# Patient Record
Sex: Female | Born: 1945 | Race: White | Hispanic: No | Marital: Married | Smoking: Never smoker
Health system: Southern US, Community
[De-identification: ages and names within clinical notes are randomized; demographics above are authoritative.]

## PROBLEM LIST (undated history)

## (undated) DIAGNOSIS — R430 Anosmia: Secondary | ICD-10-CM

## (undated) DIAGNOSIS — F32A Depression, unspecified: Secondary | ICD-10-CM

## (undated) DIAGNOSIS — F329 Major depressive disorder, single episode, unspecified: Secondary | ICD-10-CM

## (undated) DIAGNOSIS — G8929 Other chronic pain: Secondary | ICD-10-CM

## (undated) DIAGNOSIS — Z8679 Personal history of other diseases of the circulatory system: Secondary | ICD-10-CM

## (undated) DIAGNOSIS — M79604 Pain in right leg: Secondary | ICD-10-CM

## (undated) DIAGNOSIS — I2692 Saddle embolus of pulmonary artery without acute cor pulmonale: Secondary | ICD-10-CM

## (undated) DIAGNOSIS — M549 Dorsalgia, unspecified: Secondary | ICD-10-CM

## (undated) DIAGNOSIS — E785 Hyperlipidemia, unspecified: Secondary | ICD-10-CM

## (undated) HISTORY — DX: Personal history of other diseases of the circulatory system: Z86.79

## (undated) HISTORY — DX: Hyperlipidemia, unspecified: E78.5

## (undated) HISTORY — DX: Anosmia: R43.0

## (undated) HISTORY — DX: Depression, unspecified: F32.A

## (undated) HISTORY — DX: Major depressive disorder, single episode, unspecified: F32.9

## (undated) HISTORY — DX: Saddle embolus of pulmonary artery without acute cor pulmonale: I26.92

## (undated) HISTORY — PX: OTHER SURGICAL HISTORY: SHX169

## (undated) HISTORY — PX: TUBAL LIGATION: SHX77

---

## 1999-12-12 DIAGNOSIS — R430 Anosmia: Secondary | ICD-10-CM

## 1999-12-12 HISTORY — DX: Anosmia: R43.0

## 2004-11-09 ENCOUNTER — Ambulatory Visit: Payer: Self-pay | Admitting: Internal Medicine

## 2004-12-06 ENCOUNTER — Ambulatory Visit: Payer: Self-pay | Admitting: Family Medicine

## 2005-01-10 ENCOUNTER — Ambulatory Visit: Payer: Self-pay | Admitting: Internal Medicine

## 2005-01-17 ENCOUNTER — Other Ambulatory Visit: Admission: RE | Admit: 2005-01-17 | Discharge: 2005-01-17 | Payer: Self-pay | Admitting: Internal Medicine

## 2005-01-17 ENCOUNTER — Ambulatory Visit: Payer: Self-pay | Admitting: Internal Medicine

## 2005-01-17 LAB — CONVERTED CEMR LAB: Pap Smear: NORMAL

## 2005-12-11 HISTORY — PX: COLONOSCOPY: SHX174

## 2006-05-28 ENCOUNTER — Ambulatory Visit: Payer: Self-pay | Admitting: Family Medicine

## 2006-06-04 ENCOUNTER — Ambulatory Visit: Payer: Self-pay | Admitting: Internal Medicine

## 2006-06-29 ENCOUNTER — Ambulatory Visit: Payer: Self-pay | Admitting: Gastroenterology

## 2006-07-13 ENCOUNTER — Ambulatory Visit: Payer: Self-pay | Admitting: Gastroenterology

## 2006-08-08 ENCOUNTER — Ambulatory Visit: Payer: Self-pay | Admitting: Internal Medicine

## 2006-08-15 ENCOUNTER — Ambulatory Visit: Payer: Self-pay | Admitting: Internal Medicine

## 2007-03-08 ENCOUNTER — Ambulatory Visit: Payer: Self-pay | Admitting: Internal Medicine

## 2007-03-08 LAB — CONVERTED CEMR LAB
Cholesterol: 172 mg/dL (ref 0–200)
HDL: 59 mg/dL (ref >39.0)
Triglycerides: 84 mg/dL (ref 0–149)

## 2007-03-18 ENCOUNTER — Ambulatory Visit: Payer: Self-pay | Admitting: Internal Medicine

## 2007-09-06 DIAGNOSIS — E785 Hyperlipidemia, unspecified: Secondary | ICD-10-CM

## 2007-12-12 LAB — HM MAMMOGRAPHY

## 2007-12-16 ENCOUNTER — Telehealth: Payer: Self-pay | Admitting: Internal Medicine

## 2008-03-30 ENCOUNTER — Other Ambulatory Visit: Admission: RE | Admit: 2008-03-30 | Discharge: 2008-03-30 | Payer: Self-pay | Admitting: Internal Medicine

## 2008-03-30 ENCOUNTER — Ambulatory Visit: Payer: Self-pay | Admitting: Internal Medicine

## 2008-03-30 ENCOUNTER — Encounter: Payer: Self-pay | Admitting: Internal Medicine

## 2008-03-30 DIAGNOSIS — R439 Unspecified disturbances of smell and taste: Secondary | ICD-10-CM

## 2008-03-30 DIAGNOSIS — F4321 Adjustment disorder with depressed mood: Secondary | ICD-10-CM

## 2008-03-30 LAB — CONVERTED CEMR LAB
Bilirubin Urine: NEGATIVE
Cholesterol, target level: 200 mg/dL
Glucose, Urine, Semiquant: NEGATIVE
HDL goal, serum: 40 mg/dL
Ketones, urine, test strip: NEGATIVE
LDL Goal: 160 mg/dL
Specific Gravity, Urine: 1.02

## 2008-04-02 LAB — CONVERTED CEMR LAB
ALT: 25 units/L (ref 0–35)
AST: 18 units/L (ref 0–37)
Albumin: 4.1 g/dL (ref 3.5–5.2)
BUN: 14 mg/dL (ref 6–23)
Basophils Absolute: 0 10*3/uL (ref 0.0–0.1)
Basophils Relative: 0.1 % (ref 0.0–1.0)
CO2: 31 meq/L (ref 19–32)
Calcium: 9.5 mg/dL (ref 8.4–10.5)
Chloride: 106 meq/L (ref 96–112)
Cholesterol: 184 mg/dL (ref 0–200)
Creatinine, Ser: 0.6 mg/dL (ref 0.4–1.2)
Eosinophils Absolute: 0.1 10*3/uL (ref 0.0–0.7)
Eosinophils Relative: 2 % (ref 0.0–5.0)
Hemoglobin: 14 g/dL (ref 12.0–15.0)
Lymphocytes Relative: 43.9 % (ref 12.0–46.0)
MCHC: 33.2 g/dL (ref 30.0–36.0)
MCV: 91 fL (ref 78.0–100.0)
Neutro Abs: 1.6 10*3/uL (ref 1.4–7.7)
Neutrophils Relative %: 43 % (ref 43.0–77.0)
Phosphorus: 3.6 mg/dL (ref 2.3–4.6)
RBC: 4.62 M/uL (ref 3.87–5.11)
TSH: 0.57 microintl units/mL (ref 0.35–5.50)
Total Bilirubin: 1 mg/dL (ref 0.3–1.2)
WBC: 3.7 10*3/uL — ABNORMAL LOW (ref 4.5–10.5)

## 2008-04-06 ENCOUNTER — Ambulatory Visit: Payer: Self-pay | Admitting: Internal Medicine

## 2008-04-06 ENCOUNTER — Encounter: Payer: Self-pay | Admitting: Internal Medicine

## 2009-04-26 ENCOUNTER — Telehealth: Payer: Self-pay | Admitting: *Deleted

## 2009-05-07 ENCOUNTER — Telehealth: Payer: Self-pay | Admitting: Internal Medicine

## 2009-05-11 ENCOUNTER — Ambulatory Visit: Payer: Self-pay | Admitting: Internal Medicine

## 2009-05-14 ENCOUNTER — Encounter: Payer: Self-pay | Admitting: Internal Medicine

## 2009-12-01 ENCOUNTER — Ambulatory Visit: Payer: Self-pay | Admitting: Internal Medicine

## 2009-12-01 LAB — CONVERTED CEMR LAB
Nitrite: NEGATIVE
Protein, U semiquant: 100
Specific Gravity, Urine: 1.02
Urobilinogen, UA: 0.2
pH: 7

## 2009-12-02 ENCOUNTER — Encounter: Payer: Self-pay | Admitting: Internal Medicine

## 2010-09-30 ENCOUNTER — Encounter: Payer: Self-pay | Admitting: Internal Medicine

## 2010-09-30 ENCOUNTER — Ambulatory Visit: Payer: Self-pay | Admitting: Internal Medicine

## 2010-10-03 LAB — CONVERTED CEMR LAB
ALT: 21 units/L (ref 0–35)
AST: 19 units/L (ref 0–37)
Albumin: 4.2 g/dL (ref 3.5–5.2)
Alkaline Phosphatase: 81 units/L (ref 39–117)
Basophils Absolute: 0 10*3/uL (ref 0.0–0.1)
CO2: 31 meq/L (ref 19–32)
Calcium: 9.7 mg/dL (ref 8.4–10.5)
Direct LDL: 190 mg/dL
GFR calc non Af Amer: 92.58 mL/min (ref >60)
HCT: 40.9 % (ref 36.0–46.0)
HDL: 68.2 mg/dL (ref >39.00)
Lymphs Abs: 1.7 10*3/uL (ref 0.7–4.0)
Monocytes Relative: 11.3 % (ref 3.0–12.0)
Neutrophils Relative %: 42.6 % — ABNORMAL LOW (ref 43.0–77.0)
Platelets: 264 10*3/uL (ref 150.0–400.0)
Potassium: 5.4 meq/L — ABNORMAL HIGH (ref 3.5–5.1)
RDW: 12.9 % (ref 11.5–14.6)
Sodium: 144 meq/L (ref 135–145)
TSH: 0.55 microintl units/mL (ref 0.35–5.50)
Total Protein: 6.9 g/dL (ref 6.0–8.3)
Triglycerides: 92 mg/dL (ref 0.0–149.0)
VLDL: 18.4 mg/dL (ref 0.0–40.0)
WBC: 4.2 10*3/uL — ABNORMAL LOW (ref 4.5–10.5)

## 2010-10-27 ENCOUNTER — Telehealth: Payer: Self-pay | Admitting: Internal Medicine

## 2011-01-10 NOTE — Assessment & Plan Note (Signed)
Summary: cpx/pt req to come in fasting/cjr   Vital Signs:  Patient profile:   65 year old female Menstrual status:  postmenopausal Height:      63.75 inches Weight:      152 pounds BMI:     26.39 Pulse rate:   78 / minute BP sitting:   112 / 62  (left arm) Cuff size:   regular  Vitals Entered By: Romualdo Bolk, CMA (AAMA) (September 30, 2010 10:23 AM)  Nutrition Counseling: Patient's BMI is greater than 25 and therefore counseled on weight management options. CC: CPX- No pap- Pt is fasting for labs- Pt has been off meds for 6mos to 1 year. Pt wants to discuss increasing celexa.   History of Present Illness: Tiffany Lowe comes in today  for preventive visit .  Since last visit  here  there have been no major changes in health status   but has  a very stressful situation with caretaker stress    as mom living with them now .Seh continues to work ling hours. Wants to go back on celexa  because of this. Has been off lipid med for 6 months    as rand out and just didnt take. NO se of meds per pat.    Need refill ibu for aches and pain she has ocassionally.    Preventive Care Screening  Last Tetanus Booster:    Date:  09/30/2010    Results:  Tdap  Pap Smear:    Date:  04/13/2008    Results:  normal   Mammogram:    Date:  12/12/2007    Results:  normal   Prior Values:    Pap Smear:  Normal (01/17/2005)    Colonoscopy:  Normal (07/13/2006)    Last Tetanus Booster:  Td (12/11/1998)   Preventive Screening-Counseling & Management  Alcohol-Tobacco     Alcohol drinks/day: 0     Smoking Status: never  Caffeine-Diet-Exercise     Caffeine use/day: <1     Does Patient Exercise: no     MSH Depression Score: stree  Hep-HIV-STD-Contraception     Dental Visit-last 6 months no     Dental Care Counseling: to seek dental care; no dental care within six months     Sun Exposure-Excessive: no  Safety-Violence-Falls     Seat Belt Use: yes     Firearms in the Home:  firearms in the home     Firearm Counseling: not indicated; uses recommended firearm safety measures     Smoke Detectors: yes     Fall Risk: none       Blood Transfusions:  no.    Current Medications (verified): 1)  Celexa 10 Mg Tabs (Citalopram Hydrobromide) .... Take 1 Tablet By Mouth Once A Day 2)  Zocor 40 Mg Tabs (Simvastatin) .... Take 1 Tablet By Mouth At Bedtime 3)  Ibuprofen 800 Mg Tabs (Ibuprofen) .Marland Kitchen.. 1 By Mouth Every 8 Hours As Needed  Allergies (verified): No Known Drug Allergies  Past History:  Past Medical History: Hyperlipidemia mood reactive depresion anosmia 2000-05-12 s/p viral  ? rheumatic fever   Past Surgical History: Tubal ligation colonoscopy  May 12, 2006 childbirth x 2  cataract surgery  stoneburner  Past History:  Care Management: None Current  Family History: father alzheimers   died in 05/12/2005 MOthers side CAD Family History High cholesterol OA CVA diverticultitis  mom with colostomy.   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  Married Never Smoked Alcohol use-no Risk analyst Use:  yes Dental Care w/in 6 mos.:  no Sun Exposure-Excessive:  no Fall Risk:  none  Blood Transfusions:  no  Review of Systems  The patient denies anorexia, fever, weight loss, weight gain, vision loss, hoarseness, chest pain, syncope, dyspnea on exertion, peripheral edema, prolonged cough, hemoptysis, abdominal pain, melena, hematochezia, severe indigestion/heartburn, hematuria, genital sores, muscle weakness, transient blindness, difficulty walking, unusual weight change, abnormal bleeding, enlarged lymph nodes, angioedema, and breast masses.    Physical Exam  General:  alert, well-developed, and well-nourished.   Head:  normocephalic, atraumatic, and no abnormalities observed.   Eyes:  vision grossly intact, pupils equal, and pupils round.   Ears:  R ear normal, L ear normal, and no  external deformities.   Nose:  no external deformity, no external erythema, and no nasal discharge.   Mouth:  pharynx pink and moist.   Neck:  No deformities, masses, or tenderness noted. Chest Wall:  No deformities, masses, or tenderness noted. Breasts:  No mass, nodules, thickening, tenderness, bulging, retraction, inflamation, nipple discharge or skin changes noted.   Lungs:  Normal respiratory effort, chest expands symmetrically. Lungs are clear to auscultation, no crackles or wheezes.no dullness.   Heart:  Normal rate and regular rhythm. S1 and S2 normal without gallop, murmur, click, rub or other extra sounds.no lifts.   Abdomen:  Bowel sounds positive,abdomen soft and non-tender without masses, organomegaly or hernias noted. Genitalia:  declined today   Msk:  no joint warmth, no redness over joints, and no joint deformities.   Pulses:  pulses intact without delay   Extremities:  no clubbing cyanosis or edema  Neurologic:  alert & oriented X3, strength normal in all extremities, gait normal, and DTRs symmetrical and normal.   Skin:  turgor normal, color normal, no ecchymoses, no petechiae, and no purpura.   Cervical Nodes:  No lymphadenopathy noted Axillary Nodes:  No palpable lymphadenopathy Inguinal Nodes:  No significant adenopathy Psych:  Oriented X3, normally interactive, good eye contact, and not depressed appearing.  midly  distressed nl cognition and speech  EKG nsr   Impression & Recommendations:  Problem # 1:  PREVENTIVE HEALTH CARE (ICD-V70.0) Discussed nutrition,exercise,diet,healthy weight, vitamin D and calcium.  will get pap next year Orders: TLB-TSH (Thyroid Stimulating Hormone) (84443-TSH) TLB-Hepatic/Liver Function Pnl (80076-HEPATIC) TLB-CBC Platelet - w/Differential (85025-CBCD) TLB-BMP (Basic Metabolic Panel-BMET) (80048-METABOL) TLB-Lipid Panel (80061-LIPID) Venipuncture (16109) Specimen Handling (60454) EKG w/ Interpretation (93000)  Problem # 2:   HYPERLIPIDEMIA (ICD-272.4) off med  s will check today and then decide if going back on reasonable Her updated medication list for this problem includes:    Zocor 40 Mg Tabs (Simvastatin) .Marland Kitchen... Take 1 tablet by mouth at bedtime  Orders: TLB-Lipid Panel (80061-LIPID)  Problem # 3:  Hx of ADJUSTMENT DISORDER WITH DEPRESSED MOOD (ICD-309.0) Assessment: Deteriorated situational caretaker stress   cana restart clexa and increase  . it is hard for her to come in for rov because of work and home  so can call in and then make plan.    counseled   Complete Medication List: 1)  Celexa 10 Mg Tabs (Citalopram hydrobromide) .... Take 1 tablet by mouth once a day and then increase to 2 by mouth once daily   after 1-2 weeks. 2)  Zocor 40 Mg Tabs (Simvastatin) .... Take 1 tablet by mouth at bedtime 3)  Ibuprofen 800 Mg Tabs (Ibuprofen) .Marland KitchenMarland KitchenMarland Kitchen  1 by mouth every 8 hours as needed  Other Orders: Admin 1st Vaccine (16109) Flu Vaccine 76yrs + (60454) Tdap => 22yrs IM (09811) Admin of Any Addtl Vaccine (91478)  Patient Instructions: 1)  restart  10 mg celexa and after  1-2 weeks  and then increase to 20 mg per day. 2)  return office visit  or call about status  in 3-4 weeks . 3)  You will be informed of lab results when available.  4)  Get a mammogram  5)  PAP next year  Prescriptions: IBUPROFEN 800 MG TABS (IBUPROFEN) 1 by mouth every 8 hours as needed  #40 x 1   Entered and Authorized by:   Madelin Headings MD   Signed by:   Madelin Headings MD on 09/30/2010   Method used:   Electronically to        [facility]* (retail)       [address]       [place], [place]  [id number]       Ph: [phone]       Fax: [phone]   RxID:   [id number] CELEXA 10 MG TABS (CITALOPRAM HYDROBROMIDE) Take 1 tablet by mouth once a day and then increase to 2 by mouth once daily   after 1-2 weeks.  #60 x 3   Entered and Authorized by:   Madelin Headings MD   Signed by:   Madelin Headings MD on 09/30/2010    Method used:   Electronically to        [facility]* (retail)       [address]       [place], [place]  [id number]       Ph: [phone]       Fax: [phone]   RxID:   [id number]    Orders Added: 1)  Admin 1st Vaccine [90471] 2)  Flu Vaccine 36yrs + [42706] 3)  TLB-TSH (Thyroid Stimulating Hormone) [84443-TSH] 4)  TLB-Hepatic/Liver Function Pnl [80076-HEPATIC] 5)  TLB-CBC Platelet - w/Differential [85025-CBCD] 6)  TLB-BMP (Basic Metabolic Panel-BMET) [80048-METABOL] 7)  TLB-Lipid Panel [80061-LIPID] 8)  Venipuncture [36415] 9)  Specimen Handling [99000] 10)  Tdap => 27yrs IM [90715] 11)  Admin of Any Addtl Vaccine [90472] 12)  EKG w/ Interpretation [93000] 13)  Est. Patient 40-64 years [99396] 14)  Est. Patient Level III [23762]   Immunizations Administered:  Tetanus Vaccine:    Vaccine Type: Tdap    Site: right deltoid    Mfr: GlaxoSmithKline    Dose: 0.5 ml    Route: IM    Given by: Romualdo Bolk, CMA (AAMA)    Exp. Date: 09/29/2012    Lot #: [id number]   Immunizations Administered:  Tetanus Vaccine:    Vaccine Type: Tdap    Site: right deltoid    Mfr: GlaxoSmithKline    Dose: 0.5 ml    Route: IM    Given by: Romualdo Bolk, CMA (AAMA)    Exp. Date: 09/29/2012    Lot #: [id number]      Flu Vaccine Consent Questions     Do you have a history of severe allergic reactions to this vaccine? no    Any prior history of allergic reactions to egg and/or gelatin? no    Do you have a sensitivity to the preservative Thimersol? no    Do you have a past history of Guillan-Barre Syndrome? no    Do you currently have an acute febrile illness?  no    Have you ever had a severe reaction to latex? no    Vaccine information given and explained to patient? yes    Are you currently pregnant? no    Lot Number:[id number]   Exp Date:06/10/2011   Site Given  Left Deltoid IM .lbflu1 Romualdo Bolk, CMA Duncan Dull)  September 30, 2010 10:28  AM  Preventive Care Screening  Last Tetanus Booster:    Date:  09/30/2010    Results:  Tdap  Pap Smear:    Date:  04/13/2008    Results:  normal   Mammogram:    Date:  12/12/2007    Results:  normal   Prior Values:    Pap Smear:  Normal (01/17/2005)    Colonoscopy:  Normal (07/13/2006)    Last Tetanus Booster:  Td (12/11/1998)

## 2011-01-10 NOTE — Progress Notes (Signed)
Summary: FYI-celexa is working   Advice worker from Patient Call back at [facility] [phone]   Caller: Patient Summary of Call: Pt states that celexa is working fine and need rx sent to [facility]. Initial call taken by: Romualdo Bolk, CMA Duncan Dull),  October 27, 2010 3:11 PM  Follow-up for Phone Call        Rx sent to [facility]. Follow-up by: Romualdo Bolk, CMA (AAMA),  October 27, 2010 3:11 PM    New/Updated Medications: CELEXA 10 MG TABS (CITALOPRAM HYDROBROMIDE) 2 by mouth once daily Prescriptions: CELEXA 10 MG TABS (CITALOPRAM HYDROBROMIDE) 2 by mouth once daily  #180 x 1   Entered by:   Romualdo Bolk, CMA (AAMA)   Authorized by:   Madelin Headings MD   Signed by:   Romualdo Bolk, CMA (AAMA) on 10/27/2010   Method used:   Electronically to        [facility]* (retail)             ,          Ph: [phone]       Fax: [phone]   RxID:   [id number]

## 2011-03-27 ENCOUNTER — Other Ambulatory Visit: Payer: Self-pay | Admitting: Internal Medicine

## 2011-04-03 ENCOUNTER — Other Ambulatory Visit: Payer: Self-pay | Admitting: Internal Medicine

## 2011-06-16 ENCOUNTER — Telehealth: Payer: Self-pay | Admitting: Internal Medicine

## 2011-06-16 MED ORDER — SIMVASTATIN 20 MG PO TABS: ORAL_TABLET | ORAL | Status: DC

## 2011-06-16 MED ORDER — CITALOPRAM HYDROBROMIDE 10 MG PO TABS: ORAL_TABLET | ORAL | Status: DC

## 2011-06-16 NOTE — Telephone Encounter (Signed)
Rx sent to pharmacy   

## 2011-06-16 NOTE — Telephone Encounter (Signed)
Pt req new scripts for citalopram hbr tabs 10 mg and simvastatin 20 mg to [facility] order pharmacy, 90 day supply on both.

## 2011-09-28 ENCOUNTER — Encounter: Payer: Self-pay | Admitting: Internal Medicine

## 2011-10-02 ENCOUNTER — Ambulatory Visit (INDEPENDENT_AMBULATORY_CARE_PROVIDER_SITE_OTHER): Payer: 59 | Admitting: Internal Medicine

## 2011-10-02 VITALS — BP 118/78 | HR 70 | Temp 98.2°F | Wt 156.0 lb

## 2011-10-02 DIAGNOSIS — J069 Acute upper respiratory infection, unspecified: Secondary | ICD-10-CM

## 2011-10-02 DIAGNOSIS — J189 Pneumonia, unspecified organism: Secondary | ICD-10-CM

## 2011-10-02 DIAGNOSIS — J209 Acute bronchitis, unspecified: Secondary | ICD-10-CM

## 2011-10-02 MED ORDER — AZITHROMYCIN 250 MG PO TABS: 250.0000 mg | ORAL_TABLET | ORAL | Status: AC

## 2011-10-02 NOTE — Progress Notes (Signed)
  Subjective:    Patient ID: Tiffany Lowe, female    DOB: 18-Jul-1946, 65 y.o.   MRN: [id number]  HPI Patient comes in today for SDA  For acute problem evaluation. Onset 5 or more days ago   Congestion and feels bad chills and  And drainage Low grade 99.8 range and achy  temp yesterday.  No none sick at home.  Cough so much until chest aches .   But no SOb at this point.  Sinus pain in bth sides of face.  Tylenol sinus medication off and on.  No hemoptysis some green phelgm. At onset was bedridden with higher fever   Review of Systems Neg cp sob but coughing badly tried otd. No rashes current vomiting or diarrhea Oncology low dose  Citalopram.  No allergy  Sx   Past history family history social history reviewed in the electronic medical record.     Objective:   Physical Exam WDWN in NAD  quiet respirations; mildly congested  somewhat hoarse. Non toxic . Deep bronchial cough  HEENT: Normocephalic ;atraumatic , Eyes;  PERRL, EOMs  Full, lids and conjunctiva clear,,Ears: no deformities, canals nl, TM landmarks normal, Nose: no deformity or discharge but congested;face minimally tender Mouth : OP clear without lesion or edema . Neck: Supple without adenopathy or masses or bruits Chest:  bs + but abnormal bs base of right lower lobe ? Bronchial and crackle  Good air movement  CV:  S1-S2 no gallops or murmurs peripheral perfusion is normal Skin :nl perfusion and no acute rashes  No clubbing cyanosis or edema    Assessment & Plan:  Acute rti with lower right lobe abnormal exam  An some systemic sx continuing  Bronchitis vs pneumonitis atypical's   Will rx empirically at this point  And fu if  persistent or progressive   reviewed potential IA cital and azithro

## 2011-10-02 NOTE — Patient Instructions (Signed)
Take antibiotic  expect to be much better in the next 3-4 days but the cough could last another 7-10 days or so but you should be feeling better. If fever not going away call also. Can get flu shot   At end of the week or next week.

## 2011-10-07 ENCOUNTER — Encounter: Payer: Self-pay | Admitting: Internal Medicine

## 2011-10-07 DIAGNOSIS — R430 Anosmia: Secondary | ICD-10-CM | POA: Insufficient documentation

## 2011-10-07 DIAGNOSIS — Z8679 Personal history of other diseases of the circulatory system: Secondary | ICD-10-CM | POA: Insufficient documentation

## 2011-10-07 DIAGNOSIS — E785 Hyperlipidemia, unspecified: Secondary | ICD-10-CM | POA: Insufficient documentation

## 2011-10-17 ENCOUNTER — Other Ambulatory Visit: Payer: Self-pay | Admitting: Internal Medicine

## 2011-11-09 ENCOUNTER — Ambulatory Visit (INDEPENDENT_AMBULATORY_CARE_PROVIDER_SITE_OTHER): Payer: 59 | Admitting: Internal Medicine

## 2011-11-09 DIAGNOSIS — Z23 Encounter for immunization: Secondary | ICD-10-CM

## 2011-11-10 ENCOUNTER — Ambulatory Visit: Payer: 59 | Admitting: Internal Medicine

## 2012-01-24 ENCOUNTER — Ambulatory Visit (INDEPENDENT_AMBULATORY_CARE_PROVIDER_SITE_OTHER): Payer: 59 | Admitting: Internal Medicine

## 2012-01-24 ENCOUNTER — Other Ambulatory Visit (HOSPITAL_COMMUNITY)
Admission: RE | Admit: 2012-01-24 | Discharge: 2012-01-24 | Disposition: A | Discharge status: Home / Self Care | Payer: 59 | Source: Ambulatory Visit | Attending: Internal Medicine | Admitting: Internal Medicine

## 2012-01-24 ENCOUNTER — Encounter: Payer: Self-pay | Admitting: Internal Medicine

## 2012-01-24 VITALS — BP 120/80 | HR 66 | Ht 64.0 in | Wt 159.0 lb

## 2012-01-24 DIAGNOSIS — E785 Hyperlipidemia, unspecified: Secondary | ICD-10-CM

## 2012-01-24 DIAGNOSIS — M79605 Pain in left leg: Secondary | ICD-10-CM | POA: Insufficient documentation

## 2012-01-24 DIAGNOSIS — F4321 Adjustment disorder with depressed mood: Secondary | ICD-10-CM

## 2012-01-24 DIAGNOSIS — I8390 Asymptomatic varicose veins of unspecified lower extremity: Secondary | ICD-10-CM

## 2012-01-24 DIAGNOSIS — Z0000 Encounter for general adult medical examination without abnormal findings: Secondary | ICD-10-CM

## 2012-01-24 DIAGNOSIS — M79609 Pain in unspecified limb: Secondary | ICD-10-CM

## 2012-01-24 DIAGNOSIS — Z23 Encounter for immunization: Secondary | ICD-10-CM

## 2012-01-24 DIAGNOSIS — Z01419 Encounter for gynecological examination (general) (routine) without abnormal findings: Secondary | ICD-10-CM | POA: Insufficient documentation

## 2012-01-24 LAB — POCT URINALYSIS DIPSTICK
Leukocytes, UA: NEGATIVE
Nitrite, UA: NEGATIVE
Protein, UA: NEGATIVE
Urobilinogen, UA: 0.2

## 2012-01-24 LAB — HEPATIC FUNCTION PANEL
ALT: 23 U/L (ref 0–35)
AST: 17 U/L (ref 0–37)
Albumin: 3.9 g/dL (ref 3.5–5.2)
Alkaline Phosphatase: 69 U/L (ref 39–117)
Total Protein: 6.9 g/dL (ref 6.0–8.3)

## 2012-01-24 LAB — BASIC METABOLIC PANEL
CO2: 28 mEq/L (ref 19–32)
Chloride: 107 mEq/L (ref 96–112)
Glucose, Bld: 102 mg/dL — ABNORMAL HIGH (ref 70–99)
Potassium: 5.2 mEq/L — ABNORMAL HIGH (ref 3.5–5.1)
Sodium: 140 mEq/L (ref 135–145)

## 2012-01-24 LAB — CBC WITH DIFFERENTIAL/PLATELET
Basophils Absolute: 0 10*3/uL (ref 0.0–0.1)
Eosinophils Absolute: 0.2 10*3/uL (ref 0.0–0.7)
Lymphocytes Relative: 46.3 % — ABNORMAL HIGH (ref 12.0–46.0)
MCHC: 34.2 g/dL (ref 30.0–36.0)
MCV: 90.7 fl (ref 78.0–100.0)
Monocytes Absolute: 0.5 10*3/uL (ref 0.1–1.0)
Neutrophils Relative %: 34 % — ABNORMAL LOW (ref 43.0–77.0)
RDW: 13 % (ref 11.5–14.6)

## 2012-01-24 LAB — LIPID PANEL
HDL: 72.8 mg/dL (ref >39.00)
Triglycerides: 62 mg/dL (ref 0.0–149.0)

## 2012-01-24 MED ORDER — ESCITALOPRAM OXALATE 10 MG PO TABS: 10.0000 mg | ORAL_TABLET | Freq: Every day | ORAL | Status: DC

## 2012-01-24 NOTE — Assessment & Plan Note (Signed)
Change to lexapro 10 and then fu

## 2012-01-24 NOTE — Progress Notes (Signed)
Subjective:    Patient ID: Tiffany Lowe, female    DOB: Aug 25, 1946, 66 y.o.   MRN: [id number]  HPI Patient comes in today for preventive visit and follow-up of medical issues. Update of her history since her last visit.  No injury or change in meds  Stress with moms caretaking  ? If better med or helpful LIPIDS no se of meds   Hearing:  Ok   Vision:  No limitations at present .  Safety:  Has smoke detector and wears seat belts.  No firearms. No excess sun exposure. Sees dentist regularly.  Falls: no  Advance directive :  Reviewed   Memory: Felt to be good  , no concern from her or her family.  Depression: No anhedonia unusual crying see above   Nutrition: Eats well balanced diet; adequate calcium and vitamin D. No swallowing chewiing problems.  Injury: no major injuries in the last six months.  Other healthcare providers:  Reviewed today .  Social:  Lives with husband married.  Preventive parameters: up-to-date on colonoscopy, mammogram, immunizations. Including Tdap and pneumovax.  ADLS:   There are no problems or need for assistance  driving, feeding, obtaining food, dressing, toileting and bathing, managing money using phone. She is independent.      Outpatient Encounter Prescriptions as of 01/24/2012  Medication Sig Dispense Refill  . citalopram (CELEXA) 10 MG tablet       . ibuprofen (ADVIL,MOTRIN) 800 MG tablet Take 800 mg by mouth every 8 (eight) hours as needed.        . simvastatin (ZOCOR) 20 MG tablet       . DISCONTD: citalopram (CELEXA) 10 MG tablet TAKE 2 TABLETS DAILY (NEED TO CALL FOR APPOINTMENT BEFORE ANY MORE REFILLS)  180 tablet  1  .     1     Left leg tthrobs at times after a long day at work.  No swelling or redness.   Review of Systems ROS:  GEN/ HEENT: No fever, significant weight changes sweats headaches vision problems hearing changes, CV/ PULM; No chest pain shortness of breath cough, syncope,edema  change in exercise  tolerance. GI /GU: No adominal pain, vomiting, change in bowel habits. No blood in the stool. No significant GU symptoms. SKIN/HEME: ,no acute skin rashes suspicious lesions or bleeding. No lymphadenopathy, nodules, masses.  NEURO/ PSYCH:  No neurologic signs such as weakness numbness. IMM/ Allergy: No unusual infections.  Allergy .   REST of 12 system review negative except as per HPI  Past Medical History  Diagnosis Date  . Hyperlipidemia   . Depression     mood reactive  . 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 H/O: rheumatic fever     maybe    History   Social History  . Marital Status: Married    Spouse Name: N/A    Number of Children: N/A  . Years of Education: N/A   Occupational History  . Not on file.   Social History Main Topics  . Smoking status: Never Smoker   . Smokeless tobacco: Never Used  . Alcohol Use: No  . Drug Use: No  . Sexually Active: Not on file   Other Topics Concern  . 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 Tubal ligation   . Colonoscopy 05-26-2006  . Cataract surgery     stoneburner    Family History  Problem Relation Age of Onset  . Diverticulitis Mother     with colostomy.   . Alzheimer's disease Father     died 2005/05/26  . Coronary artery disease Other   . Hyperlipidemia Other   . Stroke Other     No Known Allergies  Current Outpatient Prescriptions on File Prior to Visit  Medication Sig Dispense Refill  . ibuprofen (ADVIL,MOTRIN) 800 MG tablet Take 800 mg by mouth every 8 (eight) hours as needed.          BP 120/80  Pulse 66  Ht 5\' 4"  (1.626 m)  Wt 159 lb (72.122 kg)  BMI 27.29 kg/m2        Objective:   Physical Exam Physical Exam: Vital signs reviewed ZOX:WRUE is a well-developed well-nourished alert cooperative  white female who  appears her stated age in no acute distress.  HEENT: normocephalic atraumatic , Eyes: PERRL EOM's full, conjunctiva clear, Nares: paten,t no deformity discharge or tenderness., Ears: no deformity EAC's clear TMs with normal landmarks. Mouth: clear OP, no lesions, edema.  Moist mucous membranes. Dentition in adequate repair. NECK: supple without masses, thyromegaly or bruits. CHEST/PULM:  Clear to auscultation and percussion breath sounds equal no wheeze , rales or rhonchi. No chest wall deformities or tenderness. Breast: normal by inspection . No dimpling, discharge, masses, tenderness or discharge .  CV: PMI is nondisplaced, S1 S2 no gallops, murmurs, rubs. Peripheral pulses are full without delay.No JVD .  ABDOMEN: Bowel sounds normal nontender  No guard or rebound, no hepato splenomegal no CVA tenderness.  No hernia. Extremtities:  No clubbing cyanosis or edema, no acute joint swelling or redness no focal atrophy VV no edema or redness and nl rom no point tenderenss NEURO:  Oriented x3, cranial nerves 3-12 appear to be intact, no obvious focal weakness,gait within normal limits no abnormal reflexes or asymmetrical SKIN: No acute rashes normal turgor, color, no bruising or petechiae. PSYCH: Oriented, good eye contact, no obvious depression anxiety, cognition and judgment appear normal. LN: no cervical axillary inguinal adenopathy Pelvic: NL ext GU, labia clear without lesions or rash . Atrophic  Vagina no lesions .Cervix: clear  UTERUS: Neg CMT Adnexa:  clear no masses . PAP done Oriented x 3 and no noted deficits in memory, attention, and speech.     Lab Results  Component Value Date   WBC 4.2* 09/30/2010   HGB 13.9 09/30/2010   HCT 40.9 09/30/2010   PLT 264.0 09/30/2010   GLUCOSE 97 09/30/2010   CHOL 285* 09/30/2010   TRIG 92.0 09/30/2010   HDL 68.20 09/30/2010   LDLDIRECT 190.0 09/30/2010   LDLCALC 107* 03/30/2008   ALT 21 09/30/2010   AST 19 09/30/2010   NA 144 09/30/2010   K  5.4* 09/30/2010   CL 108 09/30/2010   CREATININE 0.7 09/30/2010   BUN 14 09/30/2010   CO2 31 09/30/2010   TSH 0.55 09/30/2010           Assessment & Plan:  Preventive Health Care Counseled regarding healthy nutrition, exercise, sleep, injury prevention, calcium vit d and healthy weight . Pneumovax and  Pap today utd otherwise GYNE exam   LIPIDS; no se of meds get labs today  MOOD:   2023-05-27 do better changing meds   Generic  Caretaker stress to try  And fu in 1 month after  starting  Leg pain episodic does have VV  Normal exam otherwise  Fu if needed otherwise ibu ok.

## 2012-01-24 NOTE — Patient Instructions (Signed)
Change to lexapro 1 per day  Stop the celexa   ROVin about a month to see if this is the best dosing or med for now. Will notify you  of labs when available. Fu if leg pain is  persistent or progressive  Consider OTC compression hose  If leg ache at end of day cause of varicose veins.

## 2012-01-30 ENCOUNTER — Other Ambulatory Visit: Payer: Self-pay | Admitting: Internal Medicine

## 2012-01-30 DIAGNOSIS — R946 Abnormal results of thyroid function studies: Secondary | ICD-10-CM

## 2012-01-30 NOTE — Progress Notes (Signed)
Quick Note:  Left message to call back. ______ 

## 2012-01-30 NOTE — Progress Notes (Signed)
Quick Note:  Pt aware of results. ______ 

## 2012-02-26 ENCOUNTER — Other Ambulatory Visit (INDEPENDENT_AMBULATORY_CARE_PROVIDER_SITE_OTHER): Payer: 59

## 2012-02-26 DIAGNOSIS — D709 Neutropenia, unspecified: Secondary | ICD-10-CM

## 2012-02-26 DIAGNOSIS — R946 Abnormal results of thyroid function studies: Secondary | ICD-10-CM

## 2012-02-26 LAB — CBC WITH DIFFERENTIAL/PLATELET
Basophils Absolute: 0 10*3/uL (ref 0.0–0.1)
Eosinophils Relative: 3.9 % (ref 0.0–5.0)
HCT: 39.2 % (ref 36.0–46.0)
Hemoglobin: 13.3 g/dL (ref 12.0–15.0)
Lymphs Abs: 2.2 10*3/uL (ref 0.7–4.0)
MCV: 90.5 fl (ref 78.0–100.0)
Monocytes Absolute: 0.6 10*3/uL (ref 0.1–1.0)
Monocytes Relative: 10 % (ref 3.0–12.0)
Neutro Abs: 3 10*3/uL (ref 1.4–7.7)
RDW: 12.6 % (ref 11.5–14.6)

## 2012-02-26 LAB — TSH: TSH: 0.54 u[IU]/mL (ref 0.35–5.50)

## 2012-02-26 LAB — T4, FREE: Free T4: 0.79 ng/dL (ref 0.60–1.60)

## 2012-02-29 ENCOUNTER — Encounter: Payer: Self-pay | Admitting: *Deleted

## 2012-02-29 NOTE — Progress Notes (Signed)
Quick Note:    Letter sent to pt.  ______

## 2012-07-04 ENCOUNTER — Other Ambulatory Visit: Payer: Self-pay | Admitting: Internal Medicine

## 2012-09-25 ENCOUNTER — Ambulatory Visit (INDEPENDENT_AMBULATORY_CARE_PROVIDER_SITE_OTHER): Payer: 59

## 2012-09-25 ENCOUNTER — Other Ambulatory Visit: Payer: Self-pay | Admitting: *Deleted

## 2012-09-25 DIAGNOSIS — Z23 Encounter for immunization: Secondary | ICD-10-CM

## 2012-09-25 DIAGNOSIS — F4321 Adjustment disorder with depressed mood: Secondary | ICD-10-CM

## 2012-09-25 DIAGNOSIS — Z0000 Encounter for general adult medical examination without abnormal findings: Secondary | ICD-10-CM

## 2012-09-25 DIAGNOSIS — E785 Hyperlipidemia, unspecified: Secondary | ICD-10-CM

## 2012-09-25 MED ORDER — ESCITALOPRAM OXALATE 10 MG PO TABS: 10.0000 mg | ORAL_TABLET | Freq: Every day | ORAL | Status: DC

## 2012-10-24 LAB — HM MAMMOGRAPHY: HM Mammogram: NEGATIVE

## 2013-01-15 ENCOUNTER — Ambulatory Visit (INDEPENDENT_AMBULATORY_CARE_PROVIDER_SITE_OTHER): Payer: 59 | Admitting: Internal Medicine

## 2013-01-15 ENCOUNTER — Encounter: Payer: Self-pay | Admitting: Internal Medicine

## 2013-01-15 VITALS — BP 132/94 | HR 89 | Temp 97.7°F | Wt 169.0 lb

## 2013-01-15 DIAGNOSIS — M79609 Pain in unspecified limb: Secondary | ICD-10-CM

## 2013-01-15 DIAGNOSIS — M79604 Pain in right leg: Secondary | ICD-10-CM

## 2013-01-15 DIAGNOSIS — F4321 Adjustment disorder with depressed mood: Secondary | ICD-10-CM

## 2013-01-15 DIAGNOSIS — Z0000 Encounter for general adult medical examination without abnormal findings: Secondary | ICD-10-CM

## 2013-01-15 DIAGNOSIS — E785 Hyperlipidemia, unspecified: Secondary | ICD-10-CM

## 2013-01-15 MED ORDER — NAPROXEN 500 MG PO TABS: 500.0000 mg | ORAL_TABLET | Freq: Two times a day (BID) | ORAL | Status: DC

## 2013-01-15 MED ORDER — SIMVASTATIN 20 MG PO TABS: 20.0000 mg | ORAL_TABLET | Freq: Every day | ORAL | Status: DC

## 2013-01-15 MED ORDER — ESCITALOPRAM OXALATE 10 MG PO TABS: 10.0000 mg | ORAL_TABLET | Freq: Every day | ORAL | Status: DC

## 2013-01-15 NOTE — Addendum Note (Signed)
Addended by: Raj Janus T on: 01/15/2013 05:27 PM   Modules accepted: Orders

## 2013-01-15 NOTE — Patient Instructions (Signed)
This acts like a pinched nerve pain but uncertain cause of and   Would need further evaluation if not getting better.  Antiinflammatory for 10 - 14 days and if not a lot better by then contact us for  Referral to specialist  Ortho  Etc.

## 2013-01-15 NOTE — Progress Notes (Signed)
Chief Complaint  Patient presents with  . Leg Pain    Rt leg.  Ongoing for 1-2 weeks.    HPI: Patient comes in today for SDA for  new problem evaluation. Last office visit was a year ago.  Your onset over the last 3 weeks of right leg problem. She describes this as one episode of right leg ould give out but not weakness. But have pain from From groin to front of leg   After sitting  things are worse and hard to get up because hurts.   At night has tingling burning sensation down front of the leg to leg past knee to calf.  Not to foot no specific weakness says she can walk okay but still hurts some.No injury  Walks  And hurts  Up and down   Walking ok . No previous history of same and no injury.Not taking any meds. Marland Kitchen or this   Is due for a physical exam and laboratory monitoring needs refill of her Lexapro and simvastatin.  She is still working part-time. ROS: See pertinent positives and negatives per HPI. No bowel or bladder changes. No swelling or redness of the right leg  Past Medical History  Diagnosis Date  . Hyperlipidemia   . Depression     mood reactive  . Anosmia 05-07-00    s/p viral  . H/O: rheumatic fever     maybe    Family History  Problem Relation Age of Onset  . Diverticulitis Mother     with colostomy.   . Alzheimer's disease Father     died 05/07/2005  . Coronary artery disease Other   . Hyperlipidemia Other   . Stroke Other     History   Social History  . Marital Status: Married    Spouse Name: N/A    Number of Children: N/A  . Years of Education: N/A   Social History Main Topics  . Smoking status: Never Smoker   . Smokeless tobacco: Never Used  . Alcohol Use: No  . Drug Use: No  . Sexually Active: None   Other Topics Concern  . 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 ibuprofen (ADVIL,MOTRIN) 800 MG tablet Take 800 mg by mouth every 8 (eight) hours as needed.        . simvastatin (ZOCOR) 20 MG tablet Take 1 tablet (20 mg total) by mouth daily.  90 tablet  1  . escitalopram (LEXAPRO) 10 MG tablet Take 1 tablet (10 mg total) by mouth daily.  90 tablet  0  . naproxen (NAPROSYN) 500 MG tablet Take 1 tablet (500 mg total) by mouth 2 (two) times daily with a meal.  40 tablet  1    EXAM:  BP 132/94  Pulse 89  Temp 97.7 F (36.5 C) (Oral)  Wt 169 lb (76.658 kg)  SpO2 98%  There is no height on file to calculate BMI.  GENERAL: vitals reviewed and listed above, alert, oriented, appears well hydrated and in no acute distress  HEENT: atraumatic, conjunctiva  clear, no obvious abnormalities on inspection of external nose and ears OP : no lesion edema or exudate   NECK: no obvious masses on inspection palpation  LUNGS: clear to auscultation  bilaterally, no wheezes, rales or rhonchi, good air movement CV: HRRR, no clubbing cyanosis or  peripheral edema nl cap refill pulse  MS: moves all extremities without noticeable focal  abnormality right lower extremity without swelling focal redness or tenderness she does have some varicose veins points to her thigh behind into her shin area. When lays flat on her back stretched she has some discomfort better with flexion. No lower extremity weakness is obvious toe and heel walk okay.  No back tenderness. DTRs possible decreased right patellar compared to left 2+ no clonus is noted Gait favors her right leg that she states she walks pretty well. Skin shows no acute rashes PSYCH: pleasant and cooperative, no obvious depression or anxiety  ASSESSMENT AND PLAN:  Discussed the following assessment and plan:  1. Right leg pain    Uncertain cause but burning is reminiscent of a neuritis however some worsening laying flat  doesn't seem vascular  No obvious weakness but  discussed in great detail signs of weakness that would require her more urgent evaluation. No evidence of DVT  type exam at this time. We'll try anti-inflammatories for 7-14 days patient wishes to wait on the referral of months now better. Don't take ibuprofen with naproxen.  Risk benefit of medication discussed.  She is due for preventive visit we'll schedule this with labs refill 90 days on the simvastatin and the Lexapro. -Patient advised to return or notify health care team  if symptoms worsen or persist or new concerns arise.  Patient Instructions  This acts like a pinched nerve pain but uncertain cause of and   Would need further evaluation if not getting better.  Antiinflammatory for 10 - 14 days and if not a lot better by then contact us for  Referral to specialist  Ortho  Etc.      Neta Mends. Tara Rud M.D.

## 2013-01-29 ENCOUNTER — Telehealth: Payer: Self-pay | Admitting: Internal Medicine

## 2013-01-29 NOTE — Telephone Encounter (Signed)
Pt states that she spoke to Dr. Fabian Sharp in the past about a pinched nerve and is requesting a referral

## 2013-01-30 ENCOUNTER — Other Ambulatory Visit: Payer: Self-pay | Admitting: Family Medicine

## 2013-01-30 DIAGNOSIS — M79604 Pain in right leg: Secondary | ICD-10-CM

## 2013-01-30 NOTE — Telephone Encounter (Signed)
Order placed in the system. 

## 2013-01-30 NOTE — Telephone Encounter (Signed)
Please do referral for orthopedics dx right leg pain poss radicular pain

## 2013-03-24 ENCOUNTER — Other Ambulatory Visit: Payer: Self-pay | Admitting: Internal Medicine

## 2013-04-08 ENCOUNTER — Encounter: Payer: Self-pay | Admitting: Internal Medicine

## 2013-04-08 ENCOUNTER — Ambulatory Visit (INDEPENDENT_AMBULATORY_CARE_PROVIDER_SITE_OTHER): Payer: 59 | Admitting: Internal Medicine

## 2013-04-08 VITALS — BP 136/86 | HR 58 | Temp 97.9°F | Ht 64.25 in | Wt 172.0 lb

## 2013-04-08 DIAGNOSIS — Z0000 Encounter for general adult medical examination without abnormal findings: Secondary | ICD-10-CM

## 2013-04-08 DIAGNOSIS — E785 Hyperlipidemia, unspecified: Secondary | ICD-10-CM

## 2013-04-08 DIAGNOSIS — F4321 Adjustment disorder with depressed mood: Secondary | ICD-10-CM

## 2013-04-08 LAB — CBC WITH DIFFERENTIAL/PLATELET
Basophils Relative: 0.9 % (ref 0.0–3.0)
Eosinophils Absolute: 0.3 10*3/uL (ref 0.0–0.7)
Hemoglobin: 13.8 g/dL (ref 12.0–15.0)
MCHC: 35.5 g/dL (ref 30.0–36.0)
MCV: 88.7 fl (ref 78.0–100.0)
Monocytes Absolute: 0.4 10*3/uL (ref 0.1–1.0)
Neutro Abs: 2 10*3/uL (ref 1.4–7.7)
RBC: 4.38 Mil/uL (ref 3.87–5.11)

## 2013-04-08 LAB — LIPID PANEL: Cholesterol: 191 mg/dL (ref 0–200)

## 2013-04-08 LAB — TSH: TSH: 0.39 u[IU]/mL (ref 0.35–5.50)

## 2013-04-08 LAB — HEPATIC FUNCTION PANEL
ALT: 23 U/L (ref 0–35)
Albumin: 4 g/dL (ref 3.5–5.2)
Total Protein: 7.1 g/dL (ref 6.0–8.3)

## 2013-04-08 LAB — BASIC METABOLIC PANEL
CO2: 27 mEq/L (ref 19–32)
Chloride: 106 mEq/L (ref 96–112)
Creatinine, Ser: 0.6 mg/dL (ref 0.4–1.2)
Glucose, Bld: 105 mg/dL — ABNORMAL HIGH (ref 70–99)
Sodium: 140 mEq/L (ref 135–145)

## 2013-04-08 NOTE — Patient Instructions (Addendum)
Will notify you  of labs when available.  Can get copy sent .  Continue lifestyle intervention healthy eating and exercise . Yearly cpx and labs  .  consider bone density in the next few years. Check into cost of shingles vaccine zostavax . Can come back for injection only if want to do this.  Preventive Care for Adults, Female A healthy lifestyle and preventive care can promote health and wellness. Preventive health guidelines for women include the following key practices.  A routine yearly physical is a good way to check with your caregiver about your health and preventive screening. It is a chance to share any concerns and updates on your health, and to receive a thorough exam.  Visit your dentist for a routine exam and preventive care every 6 months. Brush your teeth twice a day and floss once a day. Good oral hygiene prevents tooth decay and gum disease.  The frequency of eye exams is based on your age, health, family medical history, use of contact lenses, and other factors. Follow your caregiver's recommendations for frequency of eye exams.  Eat a healthy diet. Foods like vegetables, fruits, whole grains, low-fat dairy products, and lean protein foods contain the nutrients you need without too many calories. Decrease your intake of foods high in solid fats, added sugars, and salt. Eat the right amount of calories for you.Get information about a proper diet from your caregiver, if necessary.  Regular physical exercise is one of the most important things you can do for your health. Most adults should get at least 150 minutes of moderate-intensity exercise (any activity that increases your heart rate and causes you to sweat) each week. In addition, most adults need muscle-strengthening exercises on 2 or more days a week.  Maintain a healthy weight. The body mass index (BMI) is a screening tool to identify possible weight problems. It provides an estimate of body fat based on height and  weight. Your caregiver can help determine your BMI, and can help you achieve or maintain a healthy weight.For adults 20 years and older:  A BMI below 18.5 is considered underweight.  A BMI of 18.5 to 24.9 is normal.  A BMI of 25 to 29.9 is considered overweight.  A BMI of 30 and above is considered obese.  Maintain normal blood lipids and cholesterol levels by exercising and minimizing your intake of saturated fat. Eat a balanced diet with plenty of fruit and vegetables. Blood tests for lipids and cholesterol should begin at age 18 and be repeated every 5 years. If your lipid or cholesterol levels are high, you are over 50, or you are at high risk for heart disease, you may need your cholesterol levels checked more frequently.Ongoing high lipid and cholesterol levels should be treated with medicines if diet and exercise are not effective.  If you smoke, find out from your caregiver how to quit. If you do not use tobacco, do not start.  If you are pregnant, do not drink alcohol. If you are breastfeeding, be very cautious about drinking alcohol. If you are not pregnant and choose to drink alcohol, do not exceed 1 drink per day. One drink is considered to be 12 ounces (355 mL) of beer, 5 ounces (148 mL) of wine, or 1.5 ounces (44 mL) of liquor.  Avoid use of street drugs. Do not share needles with anyone. Ask for help if you need support or instructions about stopping the use of drugs.  High blood pressure causes heart disease  and increases the risk of stroke. Your blood pressure should be checked at least every 1 to 2 years. Ongoing high blood pressure should be treated with medicines if weight loss and exercise are not effective.  If you are 14 to 67 years old, ask your caregiver if you should take aspirin to prevent strokes.  Diabetes screening involves taking a blood sample to check your fasting blood sugar level. This should be done once every 3 years, after age 31, if you are within normal  weight and without risk factors for diabetes. Testing should be considered at a younger age or be carried out more frequently if you are overweight and have at least 1 risk factor for diabetes.  Breast cancer screening is essential preventive care for women. You should practice "breast self-awareness." This means understanding the normal appearance and feel of your breasts and may include breast self-examination. Any changes detected, no matter how small, should be reported to a caregiver. Women in their 83s and 30s should have a clinical breast exam (CBE) by a caregiver as part of a regular health exam every 1 to 3 years. After age 60, women should have a CBE every year. Starting at age 45, women should consider having a mammography (breast X-ray test) every year. Women who have a family history of breast cancer should talk to their caregiver about genetic screening. Women at a high risk of breast cancer should talk to their caregivers about having magnetic resonance imaging (MRI) and a mammography every year.  The Pap test is a screening test for cervical cancer. A Pap test can show cell changes on the cervix that might become cervical cancer if left untreated. A Pap test is a procedure in which cells are obtained and examined from the lower end of the uterus (cervix).  Women should have a Pap test starting at age 38.  Between ages 62 and 78, Pap tests should be repeated every 2 years.  Beginning at age 62, you should have a Pap test every 3 years as long as the past 3 Pap tests have been normal.  Some women have medical problems that increase the chance of getting cervical cancer. Talk to your caregiver about these problems. It is especially important to talk to your caregiver if a new problem develops soon after your last Pap test. In these cases, your caregiver may recommend more frequent screening and Pap tests.  The above recommendations are the same for women who have or have not gotten the  vaccine for human papillomavirus (HPV).  If you had a hysterectomy for a problem that was not cancer or a condition that could lead to cancer, then you no longer need Pap tests. Even if you no longer need a Pap test, a regular exam is a good idea to make sure no other problems are starting.  If you are between ages 3 and 75, and you have had normal Pap tests going back 10 years, you no longer need Pap tests. Even if you no longer need a Pap test, a regular exam is a good idea to make sure no other problems are starting.  If you have had past treatment for cervical cancer or a condition that could lead to cancer, you need Pap tests and screening for cancer for at least 20 years after your treatment.  If Pap tests have been discontinued, risk factors (such as a new sexual partner) need to be reassessed to determine if screening should be resumed.  The  HPV test is an additional test that may be used for cervical cancer screening. The HPV test looks for the virus that can cause the cell changes on the cervix. The cells collected during the Pap test can be tested for HPV. The HPV test could be used to screen women aged 71 years and older, and should be used in women of any age who have unclear Pap test results. After the age of 35, women should have HPV testing at the same frequency as a Pap test.  Colorectal cancer can be detected and often prevented. Most routine colorectal cancer screening begins at the age of 8 and continues through age 20. However, your caregiver may recommend screening at an earlier age if you have risk factors for colon cancer. On a yearly basis, your caregiver may provide home test kits to check for hidden blood in the stool. Use of a small camera at the end of a tube, to directly examine the colon (sigmoidoscopy or colonoscopy), can detect the earliest forms of colorectal cancer. Talk to your caregiver about this at age 43, when routine screening begins. Direct examination of the  colon should be repeated every 5 to 10 years through age 40, unless early forms of pre-cancerous polyps or small growths are found.  Hepatitis C blood testing is recommended for all people born from [birth year] through 1965 and any individual with known risks for hepatitis C.  Practice safe sex. Use condoms and avoid high-risk sexual practices to reduce the spread of sexually transmitted infections (STIs). STIs include gonorrhea, chlamydia, syphilis, trichomonas, herpes, HPV, and human immunodeficiency virus (HIV). Herpes, HIV, and HPV are viral illnesses that have no cure. They can result in disability, cancer, and death. Sexually active women aged 66 and younger should be checked for chlamydia. Older women with new or multiple partners should also be tested for chlamydia. Testing for other STIs is recommended if you are sexually active and at increased risk.  Osteoporosis is a disease in which the bones lose minerals and strength with aging. This can result in serious bone fractures. The risk of osteoporosis can be identified using a bone density scan. Women ages 98 and over and women at risk for fractures or osteoporosis should discuss screening with their caregivers. Ask your caregiver whether you should take a calcium supplement or vitamin D to reduce the rate of osteoporosis.  Menopause can be associated with physical symptoms and risks. Hormone replacement therapy is available to decrease symptoms and risks. You should talk to your caregiver about whether hormone replacement therapy is right for you.  Use sunscreen with sun protection factor (SPF) of 30 or more. Apply sunscreen liberally and repeatedly throughout the day. You should seek shade when your shadow is shorter than you. Protect yourself by wearing long sleeves, pants, a wide-brimmed hat, and sunglasses year round, whenever you are outdoors.  Once a month, do a whole body skin exam, using a mirror to look at the skin on your back. Notify your  caregiver of new moles, moles that have irregular borders, moles that are larger than a pencil eraser, or moles that have changed in shape or color.  Stay current with required immunizations.  Influenza. You need a dose every fall (or winter). The composition of the flu vaccine changes each year, so being vaccinated once is not enough.  Pneumococcal polysaccharide. You need 1 to 2 doses if you smoke cigarettes or if you have certain chronic medical conditions. You need 1 dose at age  65 (or older) if you have never been vaccinated.  Tetanus, diphtheria, pertussis (Tdap, Td). Get 1 dose of Tdap vaccine if you are younger than age 89, are over 71 and have contact with an infant, are a Research scientist (physical sciences), are pregnant, or simply want to be protected from whooping cough. After that, you need a Td booster dose every 10 years. Consult your caregiver if you have not had at least 3 tetanus and diphtheria-containing shots sometime in your life or have a deep or dirty wound.  HPV. You need this vaccine if you are a woman age 21 or younger. The vaccine is given in 3 doses over 6 months.  Measles, mumps, rubella (MMR). You need at least 1 dose of MMR if you were born in 1957 or later. You may also need a second dose.  Meningococcal. If you are age 66 to 27 and a first-year college student living in a residence hall, or have one of several medical conditions, you need to get vaccinated against meningococcal disease. You may also need additional booster doses.  Zoster (shingles). If you are age 87 or older, you should get this vaccine.  Varicella (chickenpox). If you have never had chickenpox or you were vaccinated but received only 1 dose, talk to your caregiver to find out if you need this vaccine.  Hepatitis A. You need this vaccine if you have a specific risk factor for hepatitis A virus infection or you simply wish to be protected from this disease. The vaccine is usually given as 2 doses, 6 to 18 months  apart.  Hepatitis B. You need this vaccine if you have a specific risk factor for hepatitis B virus infection or you simply wish to be protected from this disease. The vaccine is given in 3 doses, usually over 6 months. Preventive Services / Frequency Ages 69 to 42  Blood pressure check.** / Every 1 to 2 years.  Lipid and cholesterol check.** / Every 5 years beginning at age 32.  Clinical breast exam.** / Every 3 years for women in their 50s and 30s.  Pap test.** / Every 2 years from ages 62 through 33. Every 3 years starting at age 19 through age 10 or 20 with a history of 3 consecutive normal Pap tests.  HPV screening.** / Every 3 years from ages 71 through ages 76 to 80 with a history of 3 consecutive normal Pap tests.  Hepatitis C blood test.** / For any individual with known risks for hepatitis C.  Skin self-exam. / Monthly.  Influenza immunization.** / Every year.  Pneumococcal polysaccharide immunization.** / 1 to 2 doses if you smoke cigarettes or if you have certain chronic medical conditions.  Tetanus, diphtheria, pertussis (Tdap, Td) immunization. / A one-time dose of Tdap vaccine. After that, you need a Td booster dose every 10 years.  HPV immunization. / 3 doses over 6 months, if you are 51 and younger.  Measles, mumps, rubella (MMR) immunization. / You need at least 1 dose of MMR if you were born in 1957 or later. You may also need a second dose.  Meningococcal immunization. / 1 dose if you are age 63 to 22 and a first-year college student living in a residence hall, or have one of several medical conditions, you need to get vaccinated against meningococcal disease. You may also need additional booster doses.  Varicella immunization.** / Consult your caregiver.  Hepatitis A immunization.** / Consult your caregiver. 2 doses, 6 to 18 months apart.  Hepatitis  B immunization.** / Consult your caregiver. 3 doses usually over 6 months. Ages 35 to 84  Blood pressure  check.** / Every 1 to 2 years.  Lipid and cholesterol check.** / Every 5 years beginning at age 108.  Clinical breast exam.** / Every year after age 70.  Mammogram.** / Every year beginning at age 60 and continuing for as long as you are in good health. Consult with your caregiver.  Pap test.** / Every 3 years starting at age 56 through age 74 or 37 with a history of 3 consecutive normal Pap tests.  HPV screening.** / Every 3 years from ages 38 through ages 60 to 52 with a history of 3 consecutive normal Pap tests.  Fecal occult blood test (FOBT) of stool. / Every year beginning at age 61 and continuing until age 4. You may not need to do this test if you get a colonoscopy every 10 years.  Flexible sigmoidoscopy or colonoscopy.** / Every 5 years for a flexible sigmoidoscopy or every 10 years for a colonoscopy beginning at age 59 and continuing until age 59.  Hepatitis C blood test.** / For all people born from [birth year] through 1965 and any individual with known risks for hepatitis C.  Skin self-exam. / Monthly.  Influenza immunization.** / Every year.  Pneumococcal polysaccharide immunization.** / 1 to 2 doses if you smoke cigarettes or if you have certain chronic medical conditions.  Tetanus, diphtheria, pertussis (Tdap, Td) immunization.** / A one-time dose of Tdap vaccine. After that, you need a Td booster dose every 10 years.  Measles, mumps, rubella (MMR) immunization. / You need at least 1 dose of MMR if you were born in 1957 or later. You may also need a second dose.  Varicella immunization.** / Consult your caregiver.  Meningococcal immunization.** / Consult your caregiver.  Hepatitis A immunization.** / Consult your caregiver. 2 doses, 6 to 18 months apart.  Hepatitis B immunization.** / Consult your caregiver. 3 doses, usually over 6 months. Ages 44 and over  Blood pressure check.** / Every 1 to 2 years.  Lipid and cholesterol check.** / Every 5 years beginning at age  6.  Clinical breast exam.** / Every year after age 30.  Mammogram.** / Every year beginning at age 42 and continuing for as long as you are in good health. Consult with your caregiver.  Pap test.** / Every 3 years starting at age 39 through age 31 or 34 with a 3 consecutive normal Pap tests. Testing can be stopped between 65 and 70 with 3 consecutive normal Pap tests and no abnormal Pap or HPV tests in the past 10 years.  HPV screening.** / Every 3 years from ages 41 through ages 76 or 40 with a history of 3 consecutive normal Pap tests. Testing can be stopped between 65 and 70 with 3 consecutive normal Pap tests and no abnormal Pap or HPV tests in the past 10 years.  Fecal occult blood test (FOBT) of stool. / Every year beginning at age 30 and continuing until age 54. You may not need to do this test if you get a colonoscopy every 10 years.  Flexible sigmoidoscopy or colonoscopy.** / Every 5 years for a flexible sigmoidoscopy or every 10 years for a colonoscopy beginning at age 13 and continuing until age 66.  Hepatitis C blood test.** / For all people born from [birth year] through 1965 and any individual with known risks for hepatitis C.  Osteoporosis screening.** / A one-time screening for women ages  65 and over and women at risk for fractures or osteoporosis.  Skin self-exam. / Monthly.  Influenza immunization.** / Every year.  Pneumococcal polysaccharide immunization.** / 1 dose at age 52 (or older) if you have never been vaccinated.  Tetanus, diphtheria, pertussis (Tdap, Td) immunization. / A one-time dose of Tdap vaccine if you are over 65 and have contact with an infant, are a Research scientist (physical sciences), or simply want to be protected from whooping cough. After that, you need a Td booster dose every 10 years.  Varicella immunization.** / Consult your caregiver.  Meningococcal immunization.** / Consult your caregiver.  Hepatitis A immunization.** / Consult your caregiver. 2 doses, 6 to 18  months apart.  Hepatitis B immunization.** / Check with your caregiver. 3 doses, usually over 6 months. ** Family history and personal history of risk and conditions may change your caregiver's recommendations. Document Released: 01/23/2002 Document Revised: 02/19/2012 Document Reviewed: 04/24/2011 [facility] Patient Information 2013 [place], [place].

## 2013-04-08 NOTE — Progress Notes (Signed)
Chief Complaint  Patient presents with  . Annual Exam    HPI: Patient comes in today for Preventive Health Care visit  No major change in health status since last visit . Prednisone  Helped  Leg and back  So   Better [employer] for mom and work fmla  No se of lipid  Med lexapro helpful. No exercise limitation sx No hx of fracture  Has had dexa in the past ? When and where.  No hx of transfusion and remote hx of blood donation ROS:  GEN/ HEENT: No fever, significant weight changes sweats headaches vision problems hearing changes, CV/ PULM; No chest pain shortness of breath cough, syncope,edema  change in exercise tolerance. GI /GU: No adominal pain, vomiting, change in bowel habits. No blood in the stool. No significant GU symptoms. Rectal itching for hours after bms many toimes  Used hemorrhoid rx ? Suggestions well otherwise no rash no wipes SKIN/HEME: ,no acute skin rashes suspicious lesions or bleeding. No lymphadenopathy, nodules, masses.  NEURO/ PSYCH:  No neurologic signs such as weakness numbness. No depression anxiety. IMM/ Allergy: No unusual infections.  Allergy .   REST of 12 system review negative except as per HPI   Past Medical History  Diagnosis Date  . Hyperlipidemia   . Depression     mood reactive  . Anosmia 04-25-2000    s/p viral  . H/O: rheumatic fever     maybe    Family History  Problem Relation Age of Onset  . Diverticulitis Mother     with colostomy.   . Alzheimer's disease Father     died Apr 25, 2005  . Coronary artery disease Other   . Hyperlipidemia Other   . Stroke Other     History   Social History  . Marital Status: Married    Spouse Name: N/A    Number of Children: N/A  . Years of Education: N/A   Social History Main Topics  . Smoking status: Never Smoker   . Smokeless tobacco: Never Used  . Alcohol Use: No  . Drug Use: No  . Sexually Active: Not on file   Other Topics Concern  . 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  Now 4 hours per day   X 5 fmla cause of mom    Husband pipe ets.   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 escitalopram (LEXAPRO) 10 MG tablet TAKE 1 TABLET DAILY  90 tablet  0  . simvastatin (ZOCOR) 20 MG tablet TAKE 1 TABLET DAILY  90 tablet  0  . ibuprofen (ADVIL,MOTRIN) 800 MG tablet Take 800 mg by mouth every 8 (eight) hours as needed.        . naproxen (NAPROSYN) 500 MG tablet Take 1 tablet (500 mg total) by mouth 2 (two) times daily with a meal.  40 tablet  1   No facility-administered encounter medications on file as of 04/08/2013.    EXAM:  BP 136/86  Pulse 58  Temp(Src) 97.9 F (36.6 C)  Ht 5' 4.25" (1.632 m)  Wt 172 lb (78.019 kg)  BMI 29.29 kg/m2  SpO2 98%  Body mass index is 29.29 kg/(m^2).  Physical Exam: Vital signs reviewed [id number] is  a well-developed well-nourished alert cooperative   female who appears her stated age in no acute distress.  HEENT: normocephalic atraumatic , Eyes: PERRL EOM's full, conjunctiva clear, Nares: paten,t no deformity discharge or tenderness., Ears: no deformity EAC's clear TMs with normal landmarks. Mouth: clear OP, no lesions, edema.  Moist mucous membranes. Dentition in adequate repair. NECK: supple without masses, thyromegaly or bruits. CHEST/PULM:  Clear to auscultation and percussion breath sounds equal no wheeze , rales or rhonchi. No chest wall deformities or tenderness. Breast: normal by inspection . No dimpling, discharge, masses, tenderness or discharge . CV: PMI is nondisplaced, S1 S2 no gallops, murmurs, rubs. Peripheral pulses are full without delay.No JVD .  ABDOMEN: Bowel sounds normal nontender  No guard or rebound, no hepato splenomegal no CVA tenderness.  No hernia. Extremtities:  No clubbing cyanosis or edema, no acute joint swelling or  redness no focal atrophy NEURO:  Oriented x3, cranial nerves 3-12 appear to be intact, no obvious focal weakness,gait within normal limits no abnormal reflexes or asymmetrical SKIN: No acute rashes normal turgor, color, no bruising or petechiae. PSYCH: Oriented, good eye contact, no obvious depression anxiety, cognition and judgment appear normal. LN: no cervical axillary inguinal adenopathy  Lab Results  Component Value Date   WBC 6.1 02/26/2012   HGB 13.3 02/26/2012   HCT 39.2 02/26/2012   PLT 232.0 02/26/2012   GLUCOSE 102* 01/24/2012   CHOL 179 01/24/2012   TRIG 62.0 01/24/2012   HDL 72.80 01/24/2012   LDLDIRECT 190.0 09/30/2010   LDLCALC 94 01/24/2012   ALT 23 01/24/2012   AST 17 01/24/2012   NA 140 01/24/2012   K 5.2* 01/24/2012   CL 107 01/24/2012   CREATININE 0.6 01/24/2012   BUN 14 01/24/2012   CO2 28 01/24/2012   TSH 0.54 02/26/2012    ASSESSMENT AND PLAN:  Discussed the following assessment and plan:  Visit for preventive health examination - Plan: Basic metabolic panel, CBC with Differential, Hemoglobin A1c, Hepatic function panel, TSH, Lipid panel  HYPERLIPIDEMIA - on meds  check lipids today - Plan: Basic metabolic panel, CBC with Differential, Hemoglobin A1c, Hepatic function panel, TSH, Lipid panel  Adjustment disorder with depressed mood - stable on meds  - Plan: Basic metabolic panel, CBC with Differential, Hemoglobin A1c, Hepatic function panel, TSH, Lipid panel  Try tucks rectal itching  As needed recheck or yearly  Patient Care Team: Madelin Headings, MD as PCP - General Jacki Cones, MD as Attending Physician (Orthopedic Surgery) Patient Instructions  Will notify you  of labs when available.  Can get copy sent .  Continue lifestyle intervention healthy eating and exercise . Yearly cpx and labs  .  consider bone density in the next few years. Check into cost of shingles vaccine zostavax . Can come back for injection only if want to do this.  Preventive Care  for Adults, Female A healthy lifestyle and preventive care can promote health and wellness. Preventive health guidelines for women include the following key practices.  A routine yearly physical is a good way to check with your caregiver about your health and preventive screening. It is a chance to share any concerns and updates on your health, and to receive a thorough exam.  Visit your dentist for a routine exam and preventive care every 6 months. Brush your teeth twice a day and floss once a day. Good oral hygiene prevents tooth decay and gum disease.  The frequency of eye exams is based on  your age, health, family medical history, use of contact lenses, and other factors. Follow your caregiver's recommendations for frequency of eye exams.  Eat a healthy diet. Foods like vegetables, fruits, whole grains, low-fat dairy products, and lean protein foods contain the nutrients you need without too many calories. Decrease your intake of foods high in solid fats, added sugars, and salt. Eat the right amount of calories for you.Get information about a proper diet from your caregiver, if necessary.  Regular physical exercise is one of the most important things you can do for your health. Most adults should get at least 150 minutes of moderate-intensity exercise (any activity that increases your heart rate and causes you to sweat) each week. In addition, most adults need muscle-strengthening exercises on 2 or more days a week.  Maintain a healthy weight. The body mass index (BMI) is a screening tool to identify possible weight problems. It provides an estimate of body fat based on height and weight. Your caregiver can help determine your BMI, and can help you achieve or maintain a healthy weight.For adults 20 years and older:  A BMI below 18.5 is considered underweight.  A BMI of 18.5 to 24.9 is normal.  A BMI of 25 to 29.9 is considered overweight.  A BMI of 30 and above is considered  obese.  Maintain normal blood lipids and cholesterol levels by exercising and minimizing your intake of saturated fat. Eat a balanced diet with plenty of fruit and vegetables. Blood tests for lipids and cholesterol should begin at age 64 and be repeated every 5 years. If your lipid or cholesterol levels are high, you are over 50, or you are at high risk for heart disease, you may need your cholesterol levels checked more frequently.Ongoing high lipid and cholesterol levels should be treated with medicines if diet and exercise are not effective.  If you smoke, find out from your caregiver how to quit. If you do not use tobacco, do not start.  If you are pregnant, do not drink alcohol. If you are breastfeeding, be very cautious about drinking alcohol. If you are not pregnant and choose to drink alcohol, do not exceed 1 drink per day. One drink is considered to be 12 ounces (355 mL) of beer, 5 ounces (148 mL) of wine, or 1.5 ounces (44 mL) of liquor.  Avoid use of street drugs. Do not share needles with anyone. Ask for help if you need support or instructions about stopping the use of drugs.  High blood pressure causes heart disease and increases the risk of stroke. Your blood pressure should be checked at least every 1 to 2 years. Ongoing high blood pressure should be treated with medicines if weight loss and exercise are not effective.  If you are 50 to 67 years old, ask your caregiver if you should take aspirin to prevent strokes.  Diabetes screening involves taking a blood sample to check your fasting blood sugar level. This should be done once every 3 years, after age 52, if you are within normal weight and without risk factors for diabetes. Testing should be considered at a younger age or be carried out more frequently if you are overweight and have at least 1 risk factor for diabetes.  Breast cancer screening is essential preventive care for women. You should practice "breast self-awareness."  This means understanding the normal appearance and feel of your breasts and may include breast self-examination. Any changes detected, no matter how small, should be reported to a caregiver.  Women in their 84s and 30s should have a clinical breast exam (CBE) by a caregiver as part of a regular health exam every 1 to 3 years. After age 23, women should have a CBE every year. Starting at age 26, women should consider having a mammography (breast X-ray test) every year. Women who have a family history of breast cancer should talk to their caregiver about genetic screening. Women at a high risk of breast cancer should talk to their caregivers about having magnetic resonance imaging (MRI) and a mammography every year.  The Pap test is a screening test for cervical cancer. A Pap test can show cell changes on the cervix that might become cervical cancer if left untreated. A Pap test is a procedure in which cells are obtained and examined from the lower end of the uterus (cervix).  Women should have a Pap test starting at age 29.  Between ages 87 and 28, Pap tests should be repeated every 2 years.  Beginning at age 13, you should have a Pap test every 3 years as long as the past 3 Pap tests have been normal.  Some women have medical problems that increase the chance of getting cervical cancer. Talk to your caregiver about these problems. It is especially important to talk to your caregiver if a new problem develops soon after your last Pap test. In these cases, your caregiver may recommend more frequent screening and Pap tests.  The above recommendations are the same for women who have or have not gotten the vaccine for human papillomavirus (HPV).  If you had a hysterectomy for a problem that was not cancer or a condition that could lead to cancer, then you no longer need Pap tests. Even if you no longer need a Pap test, a regular exam is a good idea to make sure no other problems are starting.  If you are  between ages 17 and 16, and you have had normal Pap tests going back 10 years, you no longer need Pap tests. Even if you no longer need a Pap test, a regular exam is a good idea to make sure no other problems are starting.  If you have had past treatment for cervical cancer or a condition that could lead to cancer, you need Pap tests and screening for cancer for at least 20 years after your treatment.  If Pap tests have been discontinued, risk factors (such as a new sexual partner) need to be reassessed to determine if screening should be resumed.  The HPV test is an additional test that may be used for cervical cancer screening. The HPV test looks for the virus that can cause the cell changes on the cervix. The cells collected during the Pap test can be tested for HPV. The HPV test could be used to screen women aged 45 years and older, and should be used in women of any age who have unclear Pap test results. After the age of 22, women should have HPV testing at the same frequency as a Pap test.  Colorectal cancer can be detected and often prevented. Most routine colorectal cancer screening begins at the age of 33 and continues through age 56. However, your caregiver may recommend screening at an earlier age if you have risk factors for colon cancer. On a yearly basis, your caregiver may provide home test kits to check for hidden blood in the stool. Use of a small camera at the end of a tube, to directly examine the  colon (sigmoidoscopy or colonoscopy), can detect the earliest forms of colorectal cancer. Talk to your caregiver about this at age 28, when routine screening begins. Direct examination of the colon should be repeated every 5 to 10 years through age 62, unless early forms of pre-cancerous polyps or small growths are found.  Hepatitis C blood testing is recommended for all people born from [birth year] through 1965 and any individual with known risks for hepatitis C.  Practice safe sex. Use condoms  and avoid high-risk sexual practices to reduce the spread of sexually transmitted infections (STIs). STIs include gonorrhea, chlamydia, syphilis, trichomonas, herpes, HPV, and human immunodeficiency virus (HIV). Herpes, HIV, and HPV are viral illnesses that have no cure. They can result in disability, cancer, and death. Sexually active women aged 93 and younger should be checked for chlamydia. Older women with new or multiple partners should also be tested for chlamydia. Testing for other STIs is recommended if you are sexually active and at increased risk.  Osteoporosis is a disease in which the bones lose minerals and strength with aging. This can result in serious bone fractures. The risk of osteoporosis can be identified using a bone density scan. Women ages 79 and over and women at risk for fractures or osteoporosis should discuss screening with their caregivers. Ask your caregiver whether you should take a calcium supplement or vitamin D to reduce the rate of osteoporosis.  Menopause can be associated with physical symptoms and risks. Hormone replacement therapy is available to decrease symptoms and risks. You should talk to your caregiver about whether hormone replacement therapy is right for you.  Use sunscreen with sun protection factor (SPF) of 30 or more. Apply sunscreen liberally and repeatedly throughout the day. You should seek shade when your shadow is shorter than you. Protect yourself by wearing long sleeves, pants, a wide-brimmed hat, and sunglasses year round, whenever you are outdoors.  Once a month, do a whole body skin exam, using a mirror to look at the skin on your back. Notify your caregiver of new moles, moles that have irregular borders, moles that are larger than a pencil eraser, or moles that have changed in shape or color.  Stay current with required immunizations.  Influenza. You need a dose every fall (or winter). The composition of the flu vaccine changes each year, so  being vaccinated once is not enough.  Pneumococcal polysaccharide. You need 1 to 2 doses if you smoke cigarettes or if you have certain chronic medical conditions. You need 1 dose at age 21 (or older) if you have never been vaccinated.  Tetanus, diphtheria, pertussis (Tdap, Td). Get 1 dose of Tdap vaccine if you are younger than age 72, are over 53 and have contact with an infant, are a Research scientist (physical sciences), are pregnant, or simply want to be protected from whooping cough. After that, you need a Td booster dose every 10 years. Consult your caregiver if you have not had at least 3 tetanus and diphtheria-containing shots sometime in your life or have a deep or dirty wound.  HPV. You need this vaccine if you are a woman age 2 or younger. The vaccine is given in 3 doses over 6 months.  Measles, mumps, rubella (MMR). You need at least 1 dose of MMR if you were born in 1957 or later. You may also need a second dose.  Meningococcal. If you are age 53 to 10 and a first-year college student living in a residence hall, or have one of several  medical conditions, you need to get vaccinated against meningococcal disease. You may also need additional booster doses.  Zoster (shingles). If you are age 53 or older, you should get this vaccine.  Varicella (chickenpox). If you have never had chickenpox or you were vaccinated but received only 1 dose, talk to your caregiver to find out if you need this vaccine.  Hepatitis A. You need this vaccine if you have a specific risk factor for hepatitis A virus infection or you simply wish to be protected from this disease. The vaccine is usually given as 2 doses, 6 to 18 months apart.  Hepatitis B. You need this vaccine if you have a specific risk factor for hepatitis B virus infection or you simply wish to be protected from this disease. The vaccine is given in 3 doses, usually over 6 months. Preventive Services / Frequency Ages 24 to 63  Blood pressure check.** / Every 1  to 2 years.  Lipid and cholesterol check.** / Every 5 years beginning at age 24.  Clinical breast exam.** / Every 3 years for women in their 61s and 30s.  Pap test.** / Every 2 years from ages 13 through 81. Every 3 years starting at age 65 through age 48 or 40 with a history of 3 consecutive normal Pap tests.  HPV screening.** / Every 3 years from ages 36 through ages 32 to 52 with a history of 3 consecutive normal Pap tests.  Hepatitis C blood test.** / For any individual with known risks for hepatitis C.  Skin self-exam. / Monthly.  Influenza immunization.** / Every year.  Pneumococcal polysaccharide immunization.** / 1 to 2 doses if you smoke cigarettes or if you have certain chronic medical conditions.  Tetanus, diphtheria, pertussis (Tdap, Td) immunization. / A one-time dose of Tdap vaccine. After that, you need a Td booster dose every 10 years.  HPV immunization. / 3 doses over 6 months, if you are 42 and younger.  Measles, mumps, rubella (MMR) immunization. / You need at least 1 dose of MMR if you were born in 1957 or later. You may also need a second dose.  Meningococcal immunization. / 1 dose if you are age 77 to 80 and a first-year college student living in a residence hall, or have one of several medical conditions, you need to get vaccinated against meningococcal disease. You may also need additional booster doses.  Varicella immunization.** / Consult your caregiver.  Hepatitis A immunization.** / Consult your caregiver. 2 doses, 6 to 18 months apart.  Hepatitis B immunization.** / Consult your caregiver. 3 doses usually over 6 months. Ages 5 to 22  Blood pressure check.** / Every 1 to 2 years.  Lipid and cholesterol check.** / Every 5 years beginning at age 10.  Clinical breast exam.** / Every year after age 46.  Mammogram.** / Every year beginning at age 26 and continuing for as long as you are in good health. Consult with your caregiver.  Pap test.** / Every  3 years starting at age 88 through age 33 or 52 with a history of 3 consecutive normal Pap tests.  HPV screening.** / Every 3 years from ages 27 through ages 49 to 42 with a history of 3 consecutive normal Pap tests.  Fecal occult blood test (FOBT) of stool. / Every year beginning at age 56 and continuing until age 18. You may not need to do this test if you get a colonoscopy every 10 years.  Flexible sigmoidoscopy or colonoscopy.** / Every  5 years for a flexible sigmoidoscopy or every 10 years for a colonoscopy beginning at age 58 and continuing until age 50.  Hepatitis C blood test.** / For all people born from [birth year] through 1965 and any individual with known risks for hepatitis C.  Skin self-exam. / Monthly.  Influenza immunization.** / Every year.  Pneumococcal polysaccharide immunization.** / 1 to 2 doses if you smoke cigarettes or if you have certain chronic medical conditions.  Tetanus, diphtheria, pertussis (Tdap, Td) immunization.** / A one-time dose of Tdap vaccine. After that, you need a Td booster dose every 10 years.  Measles, mumps, rubella (MMR) immunization. / You need at least 1 dose of MMR if you were born in 1957 or later. You may also need a second dose.  Varicella immunization.** / Consult your caregiver.  Meningococcal immunization.** / Consult your caregiver.  Hepatitis A immunization.** / Consult your caregiver. 2 doses, 6 to 18 months apart.  Hepatitis B immunization.** / Consult your caregiver. 3 doses, usually over 6 months. Ages 35 and over  Blood pressure check.** / Every 1 to 2 years.  Lipid and cholesterol check.** / Every 5 years beginning at age 74.  Clinical breast exam.** / Every year after age 45.  Mammogram.** / Every year beginning at age 91 and continuing for as long as you are in good health. Consult with your caregiver.  Pap test.** / Every 3 years starting at age 73 through age 51 or 39 with a 3 consecutive normal Pap tests. Testing can be  stopped between 65 and 70 with 3 consecutive normal Pap tests and no abnormal Pap or HPV tests in the past 10 years.  HPV screening.** / Every 3 years from ages 6 through ages 65 or 76 with a history of 3 consecutive normal Pap tests. Testing can be stopped between 65 and 70 with 3 consecutive normal Pap tests and no abnormal Pap or HPV tests in the past 10 years.  Fecal occult blood test (FOBT) of stool. / Every year beginning at age 38 and continuing until age 31. You may not need to do this test if you get a colonoscopy every 10 years.  Flexible sigmoidoscopy or colonoscopy.** / Every 5 years for a flexible sigmoidoscopy or every 10 years for a colonoscopy beginning at age 52 and continuing until age 43.  Hepatitis C blood test.** / For all people born from [birth year] through 1965 and any individual with known risks for hepatitis C.  Osteoporosis screening.** / A one-time screening for women ages 52 and over and women at risk for fractures or osteoporosis.  Skin self-exam. / Monthly.  Influenza immunization.** / Every year.  Pneumococcal polysaccharide immunization.** / 1 dose at age 46 (or older) if you have never been vaccinated.  Tetanus, diphtheria, pertussis (Tdap, Td) immunization. / A one-time dose of Tdap vaccine if you are over 65 and have contact with an infant, are a Research scientist (physical sciences), or simply want to be protected from whooping cough. After that, you need a Td booster dose every 10 years.  Varicella immunization.** / Consult your caregiver.  Meningococcal immunization.** / Consult your caregiver.  Hepatitis A immunization.** / Consult your caregiver. 2 doses, 6 to 18 months apart.  Hepatitis B immunization.** / Check with your caregiver. 3 doses, usually over 6 months. ** Family history and personal history of risk and conditions may change your caregiver's recommendations. Document Released: 01/23/2002 Document Revised: 02/19/2012 Document Reviewed: 04/24/2011 [facility]  Patient Information 2013 [place], [place].  Neta Mends. Panosh M.D.  Health Maintenance  Topic Date Due  . Zostavax  09/20/2006  . Influenza Vaccine  08/11/2013  . Mammogram  10/24/2014  . Colonoscopy  07/13/2016  . Tetanus/tdap  09/30/2020  . Pneumococcal Polysaccharide Vaccine Age 61 And Over  Completed   Health Maintenance Review }

## 2013-06-25 ENCOUNTER — Other Ambulatory Visit: Payer: Self-pay | Admitting: Internal Medicine

## 2013-06-27 ENCOUNTER — Ambulatory Visit (INDEPENDENT_AMBULATORY_CARE_PROVIDER_SITE_OTHER): Payer: 59 | Admitting: Family Medicine

## 2013-06-27 DIAGNOSIS — Z2911 Encounter for prophylactic immunotherapy for respiratory syncytial virus (RSV): Secondary | ICD-10-CM

## 2013-06-27 DIAGNOSIS — Z23 Encounter for immunization: Secondary | ICD-10-CM

## 2013-07-07 ENCOUNTER — Other Ambulatory Visit: Payer: Self-pay | Admitting: Internal Medicine

## 2013-07-08 NOTE — Telephone Encounter (Signed)
I do not see where you are filling this.  Please review and advise.  Thanks!!

## 2013-07-09 NOTE — Telephone Encounter (Signed)
inform patient that if we fill this medication then she should not be taking naproxyn .  Ok to refill x 1 and take the napr off of the list.

## 2013-07-10 NOTE — Telephone Encounter (Signed)
Rx sent and pt notified.  Naproxed removed from med list.

## 2013-09-14 ENCOUNTER — Encounter (HOSPITAL_COMMUNITY): Payer: Self-pay | Admitting: Emergency Medicine

## 2013-09-14 ENCOUNTER — Emergency Department (HOSPITAL_COMMUNITY): Payer: 59

## 2013-09-14 ENCOUNTER — Inpatient Hospital Stay (HOSPITAL_COMMUNITY)
Admission: EM | Admit: 2013-09-14 | Discharge: 2013-09-19 | DRG: 176 | Disposition: A | Discharge status: Home / Self Care | Payer: 59 | Attending: Internal Medicine | Admitting: Internal Medicine

## 2013-09-14 DIAGNOSIS — K219 Gastro-esophageal reflux disease without esophagitis: Secondary | ICD-10-CM

## 2013-09-14 DIAGNOSIS — D649 Anemia, unspecified: Secondary | ICD-10-CM

## 2013-09-14 DIAGNOSIS — K920 Hematemesis: Secondary | ICD-10-CM | POA: Diagnosis present

## 2013-09-14 DIAGNOSIS — K3189 Other diseases of stomach and duodenum: Secondary | ICD-10-CM | POA: Diagnosis present

## 2013-09-14 DIAGNOSIS — Z0000 Encounter for general adult medical examination without abnormal findings: Secondary | ICD-10-CM

## 2013-09-14 DIAGNOSIS — I8390 Asymptomatic varicose veins of unspecified lower extremity: Secondary | ICD-10-CM

## 2013-09-14 DIAGNOSIS — Z01419 Encounter for gynecological examination (general) (routine) without abnormal findings: Secondary | ICD-10-CM

## 2013-09-14 DIAGNOSIS — I2692 Saddle embolus of pulmonary artery without acute cor pulmonale: Secondary | ICD-10-CM

## 2013-09-14 DIAGNOSIS — R0902 Hypoxemia: Secondary | ICD-10-CM

## 2013-09-14 DIAGNOSIS — R430 Anosmia: Secondary | ICD-10-CM

## 2013-09-14 DIAGNOSIS — I824Y9 Acute embolism and thrombosis of unspecified deep veins of unspecified proximal lower extremity: Secondary | ICD-10-CM | POA: Diagnosis present

## 2013-09-14 DIAGNOSIS — M79604 Pain in right leg: Secondary | ICD-10-CM

## 2013-09-14 DIAGNOSIS — I2699 Other pulmonary embolism without acute cor pulmonale: Secondary | ICD-10-CM

## 2013-09-14 DIAGNOSIS — K30 Functional dyspepsia: Secondary | ICD-10-CM

## 2013-09-14 DIAGNOSIS — M79605 Pain in left leg: Secondary | ICD-10-CM

## 2013-09-14 DIAGNOSIS — R439 Unspecified disturbances of smell and taste: Secondary | ICD-10-CM

## 2013-09-14 DIAGNOSIS — F329 Major depressive disorder, single episode, unspecified: Secondary | ICD-10-CM | POA: Diagnosis present

## 2013-09-14 DIAGNOSIS — F4321 Adjustment disorder with depressed mood: Secondary | ICD-10-CM

## 2013-09-14 DIAGNOSIS — I824Z9 Acute embolism and thrombosis of unspecified deep veins of unspecified distal lower extremity: Secondary | ICD-10-CM | POA: Diagnosis present

## 2013-09-14 DIAGNOSIS — F3289 Other specified depressive episodes: Secondary | ICD-10-CM | POA: Diagnosis present

## 2013-09-14 DIAGNOSIS — Z8679 Personal history of other diseases of the circulatory system: Secondary | ICD-10-CM

## 2013-09-14 DIAGNOSIS — E785 Hyperlipidemia, unspecified: Secondary | ICD-10-CM

## 2013-09-14 HISTORY — DX: Dorsalgia, unspecified: M54.9

## 2013-09-14 HISTORY — DX: Pain in right leg: M79.604

## 2013-09-14 HISTORY — DX: Saddle embolus of pulmonary artery without acute cor pulmonale: I26.92

## 2013-09-14 HISTORY — DX: Other chronic pain: G89.29

## 2013-09-14 LAB — URINALYSIS W MICROSCOPIC + REFLEX CULTURE
Bilirubin Urine: NEGATIVE
Glucose, UA: >1000 mg/dL — AB
Nitrite: NEGATIVE
Specific Gravity, Urine: 1.03 (ref 1.005–1.030)
Urobilinogen, UA: 1 mg/dL (ref 0.0–1.0)

## 2013-09-14 LAB — COMPREHENSIVE METABOLIC PANEL
ALT: 24 U/L (ref 0–35)
AST: 20 U/L (ref 0–37)
Albumin: 3.7 g/dL (ref 3.5–5.2)
Alkaline Phosphatase: 121 U/L — ABNORMAL HIGH (ref 39–117)
Calcium: 9.4 mg/dL (ref 8.4–10.5)
Chloride: 101 mEq/L (ref 96–112)
Creatinine, Ser: 0.65 mg/dL (ref 0.50–1.10)
GFR calc non Af Amer: >90 mL/min (ref >90)
Total Bilirubin: 0.8 mg/dL (ref 0.3–1.2)

## 2013-09-14 LAB — PROTIME-INR: Prothrombin Time: 14.6 seconds (ref 11.6–15.2)

## 2013-09-14 LAB — TROPONIN I: Troponin I: <0.3 ng/mL (ref <0.30)

## 2013-09-14 LAB — CBC WITH DIFFERENTIAL/PLATELET
Basophils Absolute: 0 10*3/uL (ref 0.0–0.1)
Basophils Relative: 0 % (ref 0–1)
HCT: 36.9 % (ref 36.0–46.0)
Hemoglobin: 12.8 g/dL (ref 12.0–15.0)
MCH: 30.4 pg (ref 26.0–34.0)
MCHC: 34.7 g/dL (ref 30.0–36.0)
Monocytes Absolute: 0.7 10*3/uL (ref 0.1–1.0)
Neutro Abs: 6.8 10*3/uL (ref 1.7–7.7)
RDW: 12.2 % (ref 11.5–15.5)

## 2013-09-14 LAB — D-DIMER, QUANTITATIVE: D-Dimer, Quant: 13.05 ug/mL-FEU — ABNORMAL HIGH (ref 0.00–0.48)

## 2013-09-14 MED ORDER — MORPHINE SULFATE 2 MG/ML IJ SOLN
2.0000 mg | INTRAMUSCULAR | Status: DC | PRN
  Administered 2013-09-15 (×2): 2 mg via INTRAVENOUS
  Filled 2013-09-14 (×2): qty 1

## 2013-09-14 MED ORDER — COUMADIN BOOK
1.0000 | Freq: Once | Status: AC
  Administered 2013-09-15: 1
  Filled 2013-09-14: qty 1

## 2013-09-14 MED ORDER — HEPARIN (PORCINE) IN NACL 100-0.45 UNIT/ML-% IJ SOLN
1100.0000 [IU]/h | INTRAMUSCULAR | Status: DC
Start: 2013-09-14 — End: 2013-09-16
  Administered 2013-09-14 – 2013-09-15 (×2): 1100 [IU]/h via INTRAVENOUS
  Filled 2013-09-14 (×4): qty 250

## 2013-09-14 MED ORDER — WARFARIN SODIUM 5 MG PO TABS
5.0000 mg | ORAL_TABLET | ORAL | Status: AC
  Administered 2013-09-14: 5 mg via ORAL
  Filled 2013-09-14: qty 1

## 2013-09-14 MED ORDER — ACETAMINOPHEN 650 MG RE SUPP: 650.0000 mg | Freq: Four times a day (QID) | RECTAL | Status: DC | PRN

## 2013-09-14 MED ORDER — HEPARIN BOLUS VIA INFUSION
4000.0000 [IU] | Freq: Once | INTRAVENOUS | Status: AC
  Administered 2013-09-14: 4000 [IU] via INTRAVENOUS
  Filled 2013-09-14: qty 4000

## 2013-09-14 MED ORDER — SODIUM CHLORIDE 0.9 % IJ SOLN
3.0000 mL | Freq: Two times a day (BID) | INTRAMUSCULAR | Status: DC
  Administered 2013-09-15 – 2013-09-19 (×9): 3 mL via INTRAVENOUS

## 2013-09-14 MED ORDER — SIMVASTATIN 20 MG PO TABS
20.0000 mg | ORAL_TABLET | Freq: Every day | ORAL | Status: DC
  Administered 2013-09-15 – 2013-09-18 (×5): 20 mg via ORAL
  Filled 2013-09-14 (×6): qty 1

## 2013-09-14 MED ORDER — IBUPROFEN 800 MG PO TABS
800.0000 mg | ORAL_TABLET | Freq: Three times a day (TID) | ORAL | Status: DC | PRN
  Administered 2013-09-15: 800 mg via ORAL
  Filled 2013-09-14 (×2): qty 1

## 2013-09-14 MED ORDER — IOHEXOL 350 MG/ML SOLN
100.0000 mL | Freq: Once | INTRAVENOUS | Status: AC | PRN
  Administered 2013-09-14: 100 mL via INTRAVENOUS

## 2013-09-14 MED ORDER — WARFARIN VIDEO
Freq: Once | Status: AC
  Administered 2013-09-15: 12:00:00

## 2013-09-14 MED ORDER — ACETAMINOPHEN 325 MG PO TABS: 650.0000 mg | ORAL_TABLET | Freq: Four times a day (QID) | ORAL | Status: DC | PRN

## 2013-09-14 MED ORDER — ESCITALOPRAM OXALATE 10 MG PO TABS
10.0000 mg | ORAL_TABLET | Freq: Every day | ORAL | Status: DC
  Administered 2013-09-15 – 2013-09-19 (×6): 10 mg via ORAL
  Filled 2013-09-14 (×6): qty 1

## 2013-09-14 MED ORDER — HYDROCODONE-ACETAMINOPHEN 5-325 MG PO TABS
1.0000 | ORAL_TABLET | Freq: Once | ORAL | Status: AC
  Administered 2013-09-14: 1 via ORAL
  Filled 2013-09-14: qty 1

## 2013-09-14 MED ORDER — SODIUM CHLORIDE 0.9 % IV SOLN
INTRAVENOUS | Status: DC
  Administered 2013-09-14 – 2013-09-16 (×3): via INTRAVENOUS

## 2013-09-14 MED ORDER — ONDANSETRON 8 MG PO TBDP
8.0000 mg | ORAL_TABLET | Freq: Once | ORAL | Status: AC
  Administered 2013-09-14: 8 mg via ORAL
  Filled 2013-09-14: qty 1

## 2013-09-14 MED ORDER — WARFARIN - PHARMACIST DOSING INPATIENT: Freq: Every day | Status: DC

## 2013-09-14 NOTE — ED Notes (Signed)
Patient transported to CT 

## 2013-09-14 NOTE — ED Provider Notes (Signed)
CSN: [id number]     Arrival date & time 09/14/13  05-03-20 History   First MD Initiated Contact with Patient 09/14/13 1700     Chief Complaint  Patient presents with  . Back Pain  . Cough  . Nausea  . Emesis    HPI Pt was seen at 1715. Per pt, c/o gradual onset and persistence of constant left sided mid-back "pain" that began 3 days ago. Pt states the pain worsens when she lays down, takes a deep breath, or coughs. Pt states she has had a persistent cough for "the past 1 year." Has been evaluated by her PMD for same and dx with bronchitis. Pt states she also has had several episodes of N/V as well as generalized body aches/fatigue for the past 3 days. Has had home temps to "100." Denies CP/palpitations, no SOB, no abd pain, no diarrhea, no rash, no black or blood in emesis or stools.    Past Medical History  Diagnosis Date  . Hyperlipidemia   . Depression     mood reactive  . 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 H/O: rheumatic fever     maybe  . Chronic back pain   . Right leg pain    Past Surgical History  Procedure Laterality Date  . Tubal ligation    . Colonoscopy  05-03-2006  . Cataract surgery      [facility]   Family History  Problem Relation Age of Onset  . Diverticulitis Mother     with colostomy.   . Alzheimer's disease Father     died 2005-05-03  . Coronary artery disease Other   . Hyperlipidemia Other   . Stroke Other    History  Substance Use Topics  . Smoking status: Never Smoker   . Smokeless tobacco: Never Used  . Alcohol Use: No   OB History   Grav Para Term Preterm Abortions TAB SAB Ect Mult Living   2 2             Review of Systems ROS: Statement: All systems negative except as marked or noted in the HPI; Constitutional: Negative for fever and chills. +generalized body aches/fatigue.; ; Eyes: Negative for eye pain, redness and discharge. ; ; ENMT: Negative for ear pain, hoarseness, nasal congestion, sinus pressure and sore throat. ; ; Cardiovascular: Negative for  chest pain, palpitations, diaphoresis, dyspnea and peripheral edema. ; ; Respiratory: +cough. Negative for wheezing and stridor. ; ; Gastrointestinal: +N/V. Negative for diarrhea, abdominal pain, blood in stool, hematemesis, jaundice and rectal bleeding. . ; ; Genitourinary: Negative for dysuria, flank pain and hematuria. ; ; Musculoskeletal: +left mid-back pain. Negative for neck pain. Negative for swelling and trauma.; ; Skin: Negative for pruritus, rash, abrasions, blisters, bruising and skin lesion.; ; Neuro: Negative for headache, lightheadedness and neck stiffness. Negative for weakness, altered level of consciousness , altered mental status, extremity weakness, paresthesias, involuntary movement, seizure and syncope.       Allergies  Review of patient's allergies indicates no known allergies.  Home Medications   Current Outpatient Rx  Name  Route  Sig  Dispense  Refill  . escitalopram (LEXAPRO) 10 MG tablet   Oral   Take 10 mg by mouth daily.         Marland Kitchen ibuprofen (ADVIL,MOTRIN) 800 MG tablet   Oral   Take 800 mg by mouth every 8 (eight) hours as needed for pain.         . naproxen sodium (ANAPROX)  220 MG tablet   Oral   Take 220 mg by mouth 2 (two) times daily as needed (pain).         . simvastatin (ZOCOR) 20 MG tablet   Oral   Take 20 mg by mouth at bedtime.          BP 143/73  Pulse 118  Temp(Src) 98.1 F (36.7 C) (Oral)  Resp 20  SpO2 93% Physical Exam 1720: Physical examination:  Nursing notes reviewed; Vital signs and O2 SAT reviewed;  Constitutional: Well developed, Well nourished, Well hydrated, In no acute distress; Head:  Normocephalic, atraumatic; Eyes: EOMI, PERRL, No scleral icterus; ENMT: Mouth and pharynx normal, Mucous membranes moist; Neck: Supple, Full range of motion, No lymphadenopathy; Cardiovascular: Tachycardic rate and rhythm, No gallop; Respiratory: Breath sounds clear & equal bilaterally, No wheezes.  Speaking full sentences with ease,  Normal respiratory effort/excursion; Chest: Nontender, Movement normal; Abdomen: Soft, Nontender, Nondistended, Normal bowel sounds; Genitourinary: No CVA tenderness; Spine:  No midline CS, TS, LS tenderness.  +TTP left thoracic paraspinal muscles. No rash.;;Extremities: Pulses normal, No tenderness, No edema, No calf edema or asymmetry.; Neuro: AA&Ox3, Major CN grossly intact.  Speech clear. Climbs on and off stretcher easily by herself. Gait steady. No gross focal motor or sensory deficits in extremities.; Skin: Color normal, Warm, Dry.   ED Course  Procedures    MDM  MDM Reviewed: previous chart, nursing note and vitals Reviewed previous: labs Interpretation: labs, x-ray, ECG and CT scan Total time providing critical care: 30-74 minutes. This excludes time spent performing separately reportable procedures and services. Consults: critical care and admitting MD    CRITICAL CARE Performed by: Laray Anger Total critical care time: 35 Critical care time was exclusive of separately billable procedures and treating other patients. Critical care was necessary to treat or prevent imminent or life-threatening deterioration. Critical care was time spent personally by me on the following activities: development of treatment plan with patient and/or surrogate as well as nursing, discussions with consultants, evaluation of patient's response to treatment, examination of patient, obtaining history from patient or surrogate, ordering and performing treatments and interventions, ordering and review of laboratory studies, ordering and review of radiographic studies, pulse oximetry and re-evaluation of patient's condition.     Date: 09/14/2013  Rate: 94  Rhythm: normal sinus rhythm  QRS Axis: normal  Intervals: normal  ST/T Wave abnormalities: normal  Conduction Disutrbances:none  Narrative Interpretation:   Old EKG Reviewed: none available.  Results for orders placed during the hospital  encounter of 09/14/13  CBC WITH DIFFERENTIAL      Result Value Range   WBC 8.4  4.0 - 10.5 K/uL   RBC 4.21  3.87 - 5.11 MIL/uL   Hemoglobin 12.8  12.0 - 15.0 g/dL   HCT 96.2  95.2 - 84.1 %   MCV 87.6  78.0 - 100.0 fL   MCH 30.4  26.0 - 34.0 pg   MCHC 34.7  30.0 - 36.0 g/dL   RDW 32.4  40.1 - 02.7 %   Platelets 186  150 - 400 K/uL   Neutrophils Relative % 81 (*) 43 - 77 %   Neutro Abs 6.8  1.7 - 7.7 K/uL   Lymphocytes Relative 11 (*) 12 - 46 %   Lymphs Abs 0.9  0.7 - 4.0 K/uL   Monocytes Relative 9  3 - 12 %   Monocytes Absolute 0.7  0.1 - 1.0 K/uL   Eosinophils Relative 0  0 - 5 %  Eosinophils Absolute 0.0  0.0 - 0.7 K/uL   Basophils Relative 0  0 - 1 %   Basophils Absolute 0.0  0.0 - 0.1 K/uL  COMPREHENSIVE METABOLIC PANEL      Result Value Range   Sodium 137  135 - 145 mEq/L   Potassium 3.1 (*) 3.5 - 5.1 mEq/L   Chloride 101  96 - 112 mEq/L   CO2 25  19 - 32 mEq/L   Glucose, Bld 144 (*) 70 - 99 mg/dL   BUN 13  6 - 23 mg/dL   Creatinine, Ser 1.61  0.50 - 1.10 mg/dL   Calcium 9.4  8.4 - 09.6 mg/dL   Total Protein 7.4  6.0 - 8.3 g/dL   Albumin 3.7  3.5 - 5.2 g/dL   AST 20  0 - 37 U/L   ALT 24  0 - 35 U/L   Alkaline Phosphatase 121 (*) 39 - 117 U/L   Total Bilirubin 0.8  0.3 - 1.2 mg/dL   GFR calc non Af Amer >90  >90 mL/min   GFR calc Af Amer >90  >90 mL/min  LIPASE, BLOOD      Result Value Range   Lipase 19  11 - 59 U/L  TROPONIN I      Result Value Range   Troponin I <0.30  <0.30 ng/mL  D-DIMER, QUANTITATIVE      Result Value Range   D-Dimer, Quant 13.05 (*) 0.00 - 0.48 ug/mL-FEU  URINALYSIS W MICROSCOPIC + REFLEX CULTURE      Result Value Range   Color, Urine AMBER (*) YELLOW   APPearance CLEAR  CLEAR   Specific Gravity, Urine 1.030  1.005 - 1.030   pH 7.5  5.0 - 8.0   Glucose, UA >1000 (*) NEGATIVE mg/dL   Hgb urine dipstick NEGATIVE  NEGATIVE   Bilirubin Urine NEGATIVE  NEGATIVE   Ketones, ur NEGATIVE  NEGATIVE mg/dL   Protein, ur NEGATIVE  NEGATIVE  mg/dL   Urobilinogen, UA 1.0  0.0 - 1.0 mg/dL   Nitrite NEGATIVE  NEGATIVE   Leukocytes, UA TRACE (*) NEGATIVE   WBC, UA 3-6  <3 WBC/hpf   RBC / HPF 0-2  <3 RBC/hpf   Squamous Epithelial / LPF RARE  RARE   Dg Chest 2 View 09/14/2013   CLINICAL DATA:  Cough  EXAM: CHEST  2 VIEW  COMPARISON:  None.  FINDINGS: The cardiac shadow is within normal limits. The lungs are well-aerated with mild left basilar opacity. No other focal infiltrate is seen.  IMPRESSION: Mild left basilar infiltrate.   Electronically Signed   By: Alcide Clever M.D.   On: 09/14/2013 17:57   Ct Angio Chest Pe W/cm &/or Wo Cm 09/14/2013   *RADIOLOGY REPORT*  Clinical Data: Back pain, cough, elevated D-dimer  CT ANGIOGRAPHY CHEST  Technique:  Multidetector CT imaging of the chest using the standard protocol during bolus administration of intravenous contrast. Multiplanar reconstructed images including MIPs were obtained and reviewed to evaluate the vascular anatomy.  Contrast: OMNIPAQUE IOHEXOL 350 MG/ML SOLN  Comparison: Chest x-ray obtained today at 17 46 the  Findings:  Mediastinum: Heterogeneous right thyroid gland with probable 8 mm nodule.  Nonspecific borderline enlarged mediastinal lymph nodes including an 8 mm short axis prevascular node.  No mediastinal mass.  Unremarkable visualized thoracic esophagus.  Heart/Vascular: Large volume pulmonary embolus including saddle embolus in the right and left main pulmonary arteries.  Emboli extend into the bilateral lower lobe pulmonary arteries bilaterally.  The heart is within normal limits for size.  The RV/LV ratio is 0.83 which does not to suggest right heart strain. Calcified plaque along the course of the left anterior descending coronary artery consistent with underlying coronary artery disease.  Lungs/Pleura: Patchy airspace opacity in the left lung abutting the pleura consistent with areas of pulmonary infarct versus PE related alveolar hemorrhage.  Mild dependent lower lobe  atelectasis bilaterally.  No suspicious pulmonary nodule.  Linear atelectasis versus scarring in the right middle lobe.  Small left pleural effusion.  Upper Abdomen: Circumscribed hypoattenuating lesion in the central aspect of hepatic segment for a measures 1 cm and likely represents a hepatic cyst.  Otherwise, limited imaging of the upper abdomen is unremarkable.  Bones: No acute fracture or aggressive appearing lytic or blastic osseous lesion.  IMPRESSION: 1.  Large volume acute pulmonary embolus including saddle embolus and bilateral lobar, segmental and subsegmental lower lobe emboli with associated left lower lobe pulmonary infarction versus alveolar hemorrhage and small left pleural effusion.  No CT evidence of right heart strain.  The RV/LV ratio is less than 0.9. 2.  Nonspecific, not enlarged by CT criteria mediastinal lymph node is likely reactive. 3.  Atherosclerosis including coronary artery disease.  Critical Value/emergent results were called by telephone at the time of interpretation on 09/14/2013 at 08:15 p.m. to Dr. Clarene Duke, who verbally acknowledged these results.   Original Report Authenticated By: Malachy Moan, M.D.     2045:  Pt's initial O2 Sats 87-91% R/A, increase to 93-95% on O2 N/C.  Continues to deny CP/SOB. Denies any recent risk factors for PE (long travel, immobility, LE unilateral swelling), though pt's mother in the room stated "you know they told me I had a blood clot in my lung."  They both do not know if any other family members have been dx with DVT/PE. Will draw hypercoagulable panel and start IV heparin. Dx and testing d/w pt and family.  Questions answered.  Verb understanding, agreeable to admit.  T/C to PCCM Dr. Katrinka Blazing, case discussed, including:  HPI, pertinent PM/SHx, VS/PE, dx testing, ED course and treatment: requests to admit to [facility], they can consult PCCM prn. T/C to [facility] Dr. Rhona Leavens, case discussed, including:  HPI, pertinent PM/SHx, VS/PE, dx testing, ED course  and treatment:  Agreeable to admit, requests to write temporary orders, obtain tele bed to team 8.       Laray Anger, DO 09/15/13 1213

## 2013-09-14 NOTE — H&P (Signed)
[facility] History and Physical  Alizia Greif ZOX:[id number] DOB: [birth year] DOA: 09/14/2013  Referring physician: Emergency department PCP: Lorretta Harp, MD  Specialists:   Chief Complaint: Back Pain  HPI: Tiffany Lowe is a 67 y.o. female  With a hx of hld presents with complaints of lower back pain. In the ED, work up demonstrated findings of a d-dimer of 13 with a large volume acute pulm embolus seen on CTA. The patient was started on therapeutic heparin and the hospitalist was consulted for admission. On further questioning, pt reports hx of intermittent B LE pain treated w/ naproxen as an outpt. Currently denies LE swelling.  Review of Systems:  Per above, the remainder of the 10pt ros reviewed and are neg  Past Medical History  Diagnosis Date  . Hyperlipidemia   . Depression     mood reactive  . 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 H/O: rheumatic fever     maybe  . Chronic back pain   . Right leg pain    Past Surgical History  Procedure Laterality Date  . Tubal ligation    . Colonoscopy  05/02/2006  . Cataract surgery      [facility]   Social History:  reports that she has never smoked. She has never used smokeless tobacco. She reports that she does not drink alcohol or use illicit drugs.  where does patient live--home, ALF, SNF? and with whom if at home?  Can patient participate in ADLs?  No Known Allergies  Family History  Problem Relation Age of Onset  . Diverticulitis Mother     with colostomy.   . Alzheimer's disease Father     died 05-02-05  . Coronary artery disease Other   . Hyperlipidemia Other   . Stroke Other     (be sure to complete)  Prior to Admission medications   Medication Sig Start Date End Date Taking? Authorizing Provider  escitalopram (LEXAPRO) 10 MG tablet Take 10 mg by mouth daily.   Yes Historical Provider, MD  ibuprofen (ADVIL,MOTRIN) 800 MG tablet Take 800 mg by mouth every 8 (eight) hours as needed for pain.   Yes Historical  Provider, MD  naproxen sodium (ANAPROX) 220 MG tablet Take 220 mg by mouth 2 (two) times daily as needed (pain).   Yes Historical Provider, MD  simvastatin (ZOCOR) 20 MG tablet Take 20 mg by mouth at bedtime. 06/25/13  Yes Madelin Headings, MD   Physical Exam: Filed Vitals:   09/14/13 1634 09/14/13 1929 09/14/13 1935 09/14/13 2046-05-02  BP:  99/64    Pulse:  87    Temp:  98.2 F (36.8 C)    TempSrc:  Oral    Resp:  18    Height:   5\' 4"  (1.626 m) 5\' 4"  (1.626 m)  Weight:   78.7 kg (173 lb 8 oz) 78.7 kg (173 lb 8 oz)  SpO2: 93% 87%      General:  Awake, in nad  Eyes: PERRL B  ENT: membranes moist, dentition fair  Neck: trachea midline, neck supple  Cardiovascular: regular, s1, s2  Respiratory: normal resp effort, no wheezing  Abdomen: soft, nondistended  Skin: normal skin turgor, no abnormal skin lesions seen  Musculoskeletal: perfused, no clubbing, no calf tenderness B  Psychiatric: mood/affect normal // no auditory/visual hallucinations  Neurologic: cn2-12 grossly intact, strength/sensation intact  Labs on Admission:  Basic Metabolic Panel:  Recent Labs Lab 09/14/13 1801  NA 137  K 3.1*  CL 101  CO2 25  GLUCOSE 144*  BUN 13  CREATININE 0.65  CALCIUM 9.4   Liver Function Tests:  Recent Labs Lab 09/14/13 1801  AST 20  ALT 24  ALKPHOS 121*  BILITOT 0.8  PROT 7.4  ALBUMIN 3.7    Recent Labs Lab 09/14/13 1801  LIPASE 19   No results found for this basename: AMMONIA,  in the last 168 hours CBC:  Recent Labs Lab 09/14/13 1801  WBC 8.4  NEUTROABS 6.8  HGB 12.8  HCT 36.9  MCV 87.6  PLT 186   Cardiac Enzymes:  Recent Labs Lab 09/14/13 1801  TROPONINI <0.30    BNP (last 3 results) No results found for this basename: PROBNP,  in the last 8760 hours CBG: No results found for this basename: GLUCAP,  in the last 168 hours  Radiological Exams on Admission: Dg Chest 2 View  09/14/2013   CLINICAL DATA:  Cough  EXAM: CHEST  2 VIEW   COMPARISON:  None.  FINDINGS: The cardiac shadow is within normal limits. The lungs are well-aerated with mild left basilar opacity. No other focal infiltrate is seen.  IMPRESSION: Mild left basilar infiltrate.   Electronically Signed   By: Alcide Clever M.D.   On: 09/14/2013 17:57   Ct Angio Chest Pe W/cm &/or Wo Cm  09/14/2013   *RADIOLOGY REPORT*  Clinical Data: Back pain, cough, elevated D-dimer  CT ANGIOGRAPHY CHEST  Technique:  Multidetector CT imaging of the chest using the standard protocol during bolus administration of intravenous contrast. Multiplanar reconstructed images including MIPs were obtained and reviewed to evaluate the vascular anatomy.  Contrast: OMNIPAQUE IOHEXOL 350 MG/ML SOLN  Comparison: Chest x-ray obtained today at 17 46 the  Findings:  Mediastinum: Heterogeneous right thyroid gland with probable 8 mm nodule.  Nonspecific borderline enlarged mediastinal lymph nodes including an 8 mm short axis prevascular node.  No mediastinal mass.  Unremarkable visualized thoracic esophagus.  Heart/Vascular: Large volume pulmonary embolus including saddle embolus in the right and left main pulmonary arteries.  Emboli extend into the bilateral lower lobe pulmonary arteries bilaterally.  The heart is within normal limits for size.  The RV/LV ratio is 0.83 which does not to suggest right heart strain. Calcified plaque along the course of the left anterior descending coronary artery consistent with underlying coronary artery disease.  Lungs/Pleura: Patchy airspace opacity in the left lung abutting the pleura consistent with areas of pulmonary infarct versus PE related alveolar hemorrhage.  Mild dependent lower lobe atelectasis bilaterally.  No suspicious pulmonary nodule.  Linear atelectasis versus scarring in the right middle lobe.  Small left pleural effusion.  Upper Abdomen: Circumscribed hypoattenuating lesion in the central aspect of hepatic segment for a measures 1 cm and likely represents a  hepatic cyst.  Otherwise, limited imaging of the upper abdomen is unremarkable.  Bones: No acute fracture or aggressive appearing lytic or blastic osseous lesion.  IMPRESSION: 1.  Large volume acute pulmonary embolus including saddle embolus and bilateral lobar, segmental and subsegmental lower lobe emboli with associated left lower lobe pulmonary infarction versus alveolar hemorrhage and small left pleural effusion.  No CT evidence of right heart strain.  The RV/LV ratio is less than 0.9. 2.  Nonspecific, not enlarged by CT criteria mediastinal lymph node is likely reactive. 3.  Atherosclerosis including coronary artery disease.  Critical Value/emergent results were called by telephone at the time of interpretation on 09/14/2013 at 08:15 p.m. to Dr. Clarene Duke, who verbally acknowledged these results.   Original  Report Authenticated By: Malachy Moan, M.D.    Assessment/Plan Principal Problem:   Saddle embolism of pulmonary artery Active Problems:   HYPERLIPIDEMIA   Hypoxia   1. Saddle PE 1. Heparin started in ED 2. Will start therapeutic coumadin 3. Will admit to med-tele 4. Cont O2 as needed. Documented o2 sat of 87% in the ED 5. Hypercoag panel has already been drawn in the ED 6. Will check LE dopplers 2. HLD 1. Cont meds 3. Hypoxia 1. Per above, cont o2 as needed  Code Status: Full (must indicate code status--if unknown or must be presumed, indicate so) Family Communication: Pt in room (indicate person spoken with, if applicable, with phone number if by telephone) Disposition Plan: Pending (indicate anticipated LOS)  Time spent:  CHIU, STEPHEN K [facility] Pager [phone]  If 7PM-7AM, please contact night-coverage [email or web address] Password [email or web address] 09/14/2013, 9:03 PM

## 2013-09-14 NOTE — ED Notes (Signed)
Pt sent from [facility] with L lower back pain. Pt reports that when she breathes in deep, it hurts. Pt adds that she has had a cough x1 year. Pt denies urinary s/sx, but states N/V/D. Pt in A&O and in NAD

## 2013-09-14 NOTE — Progress Notes (Addendum)
ANTICOAGULATION CONSULT NOTE - Initial Consult  Pharmacy Consult for IV heparin+warfarin Indication: pulmonary embolus  No Known Allergies  Patient Measurements: Height: 5\' 4"  (162.6 cm) Weight: 173 lb 8 oz (78.7 kg) IBW/kg (Calculated) : 54.7 Heparin Dosing Weight: 71.5  Vital Signs: Temp: 98.2 F (36.8 C) (10/05 1929) Temp src: Oral (10/05 1929) BP: 99/64 mmHg (10/05 1929) Pulse Rate: 87 (10/05 1929)  Labs:  Recent Labs  09/14/13 1801  HGB 12.8  HCT 36.9  PLT 186  CREATININE 0.65  TROPONINI <0.30    Estimated Creatinine Clearance: 70.2 ml/min (by C-G formula based on Cr of 0.65).   Medical History: Past Medical History  Diagnosis Date  . Hyperlipidemia   . Depression     mood reactive  . 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 H/O: rheumatic fever     maybe  . Chronic back pain   . Right leg pain     Assessment: Tiffany Lowe sent from [facility] with L lower back pain and pain with deep inspiration. CTA showed "large volume acute pulmonary embolus including saddle embolus and bilateral lobar, segmental and subsegmental lower lobe emboli with associated left lower lobe pulmonary infarction versus alveolar hemorrhage and small left pleural effusion". MD order to start IV heparin. CBC wnl, no bleeding, no anticoagulants PTA.   Warfarin per pharmacy ordered after heparin started.  Goal of Therapy:  Heparin level 0.3-0.7 units/ml Monitor platelets by anticoagulation protocol: Yes INR 2-3   Plan:   IV heparin 4000 units loading dose then IV heparin at 1100 units/hr  Daily heparin level and CBC   Please consider starting oral anticoagulants early  Pharmacy will f/u   Warfarin 5mg  po x1 now  Daily INR     Geoffry Paradise, PharmD, BCPS Pager: [phone] 8:52 PM Pharmacy #: [phone]

## 2013-09-15 DIAGNOSIS — I2699 Other pulmonary embolism without acute cor pulmonale: Secondary | ICD-10-CM

## 2013-09-15 LAB — PROTEIN C, TOTAL: Protein C, Total: 92 % (ref 72–160)

## 2013-09-15 LAB — COMPREHENSIVE METABOLIC PANEL
Albumin: 3 g/dL — ABNORMAL LOW (ref 3.5–5.2)
Alkaline Phosphatase: 98 U/L (ref 39–117)
BUN: 11 mg/dL (ref 6–23)
CO2: 25 mEq/L (ref 19–32)
Chloride: 104 mEq/L (ref 96–112)
Creatinine, Ser: 0.61 mg/dL (ref 0.50–1.10)
GFR calc Af Amer: >90 mL/min (ref >90)
GFR calc non Af Amer: >90 mL/min (ref >90)
Glucose, Bld: 134 mg/dL — ABNORMAL HIGH (ref 70–99)
Potassium: 3.4 mEq/L — ABNORMAL LOW (ref 3.5–5.1)
Total Bilirubin: 1 mg/dL (ref 0.3–1.2)

## 2013-09-15 LAB — IRON AND TIBC
Saturation Ratios: 7 % — ABNORMAL LOW (ref 20–55)
TIBC: 242 ug/dL — ABNORMAL LOW (ref 250–470)

## 2013-09-15 LAB — CARDIOLIPIN ANTIBODIES, IGG, IGM, IGA: Anticardiolipin IgM: 18 MPL U/mL — ABNORMAL HIGH (ref <11)

## 2013-09-15 LAB — CBC
HCT: 31.8 % — ABNORMAL LOW (ref 36.0–46.0)
Hemoglobin: 10.9 g/dL — ABNORMAL LOW (ref 12.0–15.0)
MCV: 87.4 fL (ref 78.0–100.0)
RBC: 3.64 MIL/uL — ABNORMAL LOW (ref 3.87–5.11)
RDW: 12.1 % (ref 11.5–15.5)
WBC: 7 10*3/uL (ref 4.0–10.5)

## 2013-09-15 LAB — VITAMIN B12: Vitamin B-12: 644 pg/mL (ref 211–911)

## 2013-09-15 LAB — RETICULOCYTES
RBC.: 3.7 MIL/uL — ABNORMAL LOW (ref 3.87–5.11)
Retic Count, Absolute: 66.6 10*3/uL (ref 19.0–186.0)
Retic Ct Pct: 1.8 % (ref 0.4–3.1)

## 2013-09-15 LAB — HEPARIN LEVEL (UNFRACTIONATED): Heparin Unfractionated: 0.63 IU/mL (ref 0.30–0.70)

## 2013-09-15 LAB — LUPUS ANTICOAGULANT PANEL: PTT Lupus Anticoagulant: 35.4 secs (ref 28.0–43.0)

## 2013-09-15 LAB — PROTIME-INR
INR: 1.13 (ref 0.00–1.49)
Prothrombin Time: 14.3 seconds (ref 11.6–15.2)

## 2013-09-15 LAB — BETA-2-GLYCOPROTEIN I ABS, IGG/M/A
Beta-2 Glyco I IgG: 0 G Units (ref <20)
Beta-2-Glycoprotein I IgA: 0 A Units (ref <20)
Beta-2-Glycoprotein I IgM: 16 M Units (ref <20)

## 2013-09-15 LAB — PROTEIN S ACTIVITY: Protein S Activity: 80 % (ref 69–129)

## 2013-09-15 LAB — FERRITIN: Ferritin: 326 ng/mL — ABNORMAL HIGH (ref 10–291)

## 2013-09-15 LAB — FACTOR 5 LEIDEN

## 2013-09-15 LAB — PROTHROMBIN GENE MUTATION

## 2013-09-15 MED ORDER — HYDROCODONE-ACETAMINOPHEN 5-325 MG PO TABS
1.0000 | ORAL_TABLET | ORAL | Status: DC | PRN
  Administered 2013-09-15 – 2013-09-17 (×5): 2 via ORAL
  Administered 2013-09-18: 1 via ORAL
  Filled 2013-09-15: qty 1
  Filled 2013-09-15 (×5): qty 2

## 2013-09-15 MED ORDER — WARFARIN SODIUM 5 MG PO TABS
5.0000 mg | ORAL_TABLET | Freq: Once | ORAL | Status: AC
  Administered 2013-09-15: 5 mg via ORAL
  Filled 2013-09-15: qty 1

## 2013-09-15 MED ORDER — POTASSIUM CHLORIDE CRYS ER 20 MEQ PO TBCR
40.0000 meq | EXTENDED_RELEASE_TABLET | Freq: Two times a day (BID) | ORAL | Status: AC
  Administered 2013-09-15 (×2): 40 meq via ORAL
  Filled 2013-09-15 (×2): qty 2

## 2013-09-15 NOTE — Progress Notes (Signed)
[facility] PROGRESS NOTE  Tiffany Lowe VHQ:[id number] DOB: [birth year] DOA: 09/14/2013 PCP: Lorretta Harp, MD  Assessment/Plan: Saddle PE  1. Heparin started in ED 2. Will start therapeutic coumadin. 3. Cont O2 as needed. Documented o2 sat of 87% in the ED 4. Hypercoag panel has already been drawn in the ED 5. Will check LE dopplers and echo HLD  1. Cont meds Hypoxia  1. Per above, cont o2 as needed.  Code Status: FULL CODE Family Communication: family at bedside Disposition Plan: pending.   Consultants:  none  Procedures:   CTangio  Antibiotics:  none  HPI/Subjective: Reports back pain.  Objective: Filed Vitals:   09/15/13 1353  BP: 124/55  Pulse: 65  Temp: 97.9 F (36.6 C)  Resp: 16    Intake/Output Summary (Last 24 hours) at 09/15/13 1537 Last data filed at 09/15/13 0930  Gross per 24 hour  Intake    240 ml  Output      0 ml  Net    240 ml   Filed Weights   09/14/13 1935 09/14/13 2047 09/14/13 2232  Weight: 78.7 kg (173 lb 8 oz) 78.7 kg (173 lb 8 oz) 78.608 kg (173 lb 4.8 oz)    Exam:   General:  Alert afebriel comfortable  Cardiovascular: s1s2  Respiratory: ctab  Abdomen: soft NT   ND BS+   Data Reviewed: Basic Metabolic Panel:  Recent Labs Lab 09/14/13 1801 09/15/13 0440  NA 137 137  K 3.1* 3.4*  CL 101 104  CO2 25 25  GLUCOSE 144* 134*  BUN 13 11  CREATININE 0.65 0.61  CALCIUM 9.4 8.7   Liver Function Tests:  Recent Labs Lab 09/14/13 1801 09/15/13 0440  AST 20 15  ALT 24 19  ALKPHOS 121* 98  BILITOT 0.8 1.0  PROT 7.4 6.1  ALBUMIN 3.7 3.0*    Recent Labs Lab 09/14/13 1801  LIPASE 19   No results found for this basename: AMMONIA,  in the last 168 hours CBC:  Recent Labs Lab 09/14/13 1801 09/15/13 0440  WBC 8.4 7.0  NEUTROABS 6.8  --   HGB 12.8 10.9*  HCT 36.9 31.8*  MCV 87.6 87.4  PLT 186 164   Cardiac Enzymes:  Recent Labs Lab 09/14/13 1801  TROPONINI <0.30   BNP (last 3  results) No results found for this basename: PROBNP,  in the last 8760 hours CBG: No results found for this basename: GLUCAP,  in the last 168 hours  No results found for this or any previous visit (from the past 240 hour(s)).   Studies: Dg Chest 2 View  09/14/2013   CLINICAL DATA:  Cough  EXAM: CHEST  2 VIEW  COMPARISON:  None.  FINDINGS: The cardiac shadow is within normal limits. The lungs are well-aerated with mild left basilar opacity. No other focal infiltrate is seen.  IMPRESSION: Mild left basilar infiltrate.   Electronically Signed   By: Alcide Clever M.D.   On: 09/14/2013 17:57   Ct Angio Chest Pe W/cm &/or Wo Cm  09/14/2013   *RADIOLOGY REPORT*  Clinical Data: Back pain, cough, elevated D-dimer  CT ANGIOGRAPHY CHEST  Technique:  Multidetector CT imaging of the chest using the standard protocol during bolus administration of intravenous contrast. Multiplanar reconstructed images including MIPs were obtained and reviewed to evaluate the vascular anatomy.  Contrast: OMNIPAQUE IOHEXOL 350 MG/ML SOLN  Comparison: Chest x-ray obtained today at 17 46 the  Findings:  Mediastinum: Heterogeneous right thyroid gland with probable 8  mm nodule.  Nonspecific borderline enlarged mediastinal lymph nodes including an 8 mm short axis prevascular node.  No mediastinal mass.  Unremarkable visualized thoracic esophagus.  Heart/Vascular: Large volume pulmonary embolus including saddle embolus in the right and left main pulmonary arteries.  Emboli extend into the bilateral lower lobe pulmonary arteries bilaterally.  The heart is within normal limits for size.  The RV/LV ratio is 0.83 which does not to suggest right heart strain. Calcified plaque along the course of the left anterior descending coronary artery consistent with underlying coronary artery disease.  Lungs/Pleura: Patchy airspace opacity in the left lung abutting the pleura consistent with areas of pulmonary infarct versus PE related alveolar  hemorrhage.  Mild dependent lower lobe atelectasis bilaterally.  No suspicious pulmonary nodule.  Linear atelectasis versus scarring in the right middle lobe.  Small left pleural effusion.  Upper Abdomen: Circumscribed hypoattenuating lesion in the central aspect of hepatic segment for a measures 1 cm and likely represents a hepatic cyst.  Otherwise, limited imaging of the upper abdomen is unremarkable.  Bones: No acute fracture or aggressive appearing lytic or blastic osseous lesion.  IMPRESSION: 1.  Large volume acute pulmonary embolus including saddle embolus and bilateral lobar, segmental and subsegmental lower lobe emboli with associated left lower lobe pulmonary infarction versus alveolar hemorrhage and small left pleural effusion.  No CT evidence of right heart strain.  The RV/LV ratio is less than 0.9. 2.  Nonspecific, not enlarged by CT criteria mediastinal lymph node is likely reactive. 3.  Atherosclerosis including coronary artery disease.  Critical Value/emergent results were called by telephone at the time of interpretation on 09/14/2013 at 08:15 p.m. to Dr. Clarene Duke, who verbally acknowledged these results.   Original Report Authenticated By: Malachy Moan, M.D.    Scheduled Meds: . escitalopram  10 mg Oral Daily  . potassium chloride  40 mEq Oral BID  . simvastatin  20 mg Oral QHS  . sodium chloride  3 mL Intravenous Q12H  . warfarin  5 mg Oral ONCE-1800  . Warfarin - Pharmacist Dosing Inpatient   Does not apply q1800   Continuous Infusions: . sodium chloride 100 mL/hr at 09/14/13 2200  . heparin 1,100 Units/hr (09/15/13 1344)    Principal Problem:   Saddle embolism of pulmonary artery Active Problems:   HYPERLIPIDEMIA   Hypoxia    Time spent: 25 minutes.    [facility]  [facility] Pager [phone] If 7PM-7AM, please contact night-coverage at [email or web address], password [facility] 09/15/2013, 3:37 PM  LOS: 1 day

## 2013-09-15 NOTE — Progress Notes (Signed)
ANTICOAGULATION CONSULT NOTE - Follow Up Consult  Pharmacy Consult for Heparin/Warfarin Indication: New PE, DVT  No Known Allergies  Patient Measurements: Height: 5\' 4"  (162.6 cm) Weight: 173 lb 4.8 oz (78.608 kg) IBW/kg (Calculated) : 54.7 Heparin Dosing Weight: 71.5kg  Vital Signs: Temp: 98.2 F (36.8 C) (10/06 1100) Temp src: Oral (10/06 1100) BP: 100/60 mmHg (10/06 1100) Pulse Rate: 75 (10/06 1100)  Labs:  Recent Labs  09/14/13 1801 09/14/13 2059 09/15/13 0440 09/15/13 1045  HGB 12.8  --  10.9*  --   HCT 36.9  --  31.8*  --   PLT 186  --  164  --   APTT  --  34  --   --   LABPROT  --  14.6 14.3  --   INR  --  1.16 1.13  --   HEPARINUNFRC  --   --  0.63 0.53  CREATININE 0.65  --  0.61  --   TROPONINI <0.30  --   --   --     Estimated Creatinine Clearance: 70.2 ml/min (by C-G formula based on Cr of 0.61).   Medications:  Scheduled:  . escitalopram  10 mg Oral Daily  . simvastatin  20 mg Oral QHS  . sodium chloride  3 mL Intravenous Q12H  . warfarin   Does not apply Once  . Warfarin - Pharmacist Dosing Inpatient   Does not apply q1800   Infusions:  . sodium chloride 100 mL/hr at 09/14/13 2200  . heparin 1,100 Units/hr (09/14/13 2242)  Inpatient warfarin doses: 5mg  (10/5)  [name] admitted 10/5 with lower back pain and pain with deep inspiration. CTa showed large PE including saddle embolus; doppler shows left DVT.   Day #2 of 5 minimum IV heparin and warfarin  Heparin level therapeutic x 2 on 1100 units/hr  INR unchanged as expected after 1st dose of warfarin  CBC slightly decreased but no bleeding/complications reported.  Goal of Therapy:  Heparin level 0.3-0.7 units/ml INR 2-3 Monitor platelets by anticoagulation protocol: Yes   Plan:   Continue heparin 1100 units/hr - transition to lovenox?  Repeat warfarin 5mg  po today  Daily PT/INR, CBC, Heparin level  Provide warfarin education prior to discharge  Tiffany Lowe, PharmD, BCPS Pager: [phone] 09/15/2013,11:34 AM

## 2013-09-15 NOTE — Progress Notes (Deleted)
Pt discharged to [facility] via [facility]. Pt was alert and stable at time of discharge. Pt son present and accompanied mother to facility.     

## 2013-09-15 NOTE — Progress Notes (Signed)
VASCULAR LAB PRELIMINARY  PRELIMINARY  PRELIMINARY  PRELIMINARY  Bilateral lower extremity venous duplex  completed.    Preliminary report:  Right:  No evidence of DVT, superficial thrombosis, or Baker's cyst.  Left: DVT noted from the mid femoral vein through the popliteal, gastrocnemius, posterior tibial, and peroneal veins.  No evidence of superficial thrombosis.  No Baker's cyst.   Treyce Spillers, RVT 09/15/2013, 9:53 AM

## 2013-09-16 ENCOUNTER — Other Ambulatory Visit (HOSPITAL_COMMUNITY): Payer: 59

## 2013-09-16 DIAGNOSIS — I369 Nonrheumatic tricuspid valve disorder, unspecified: Secondary | ICD-10-CM

## 2013-09-16 DIAGNOSIS — M79609 Pain in unspecified limb: Secondary | ICD-10-CM

## 2013-09-16 LAB — CBC
MCH: 30.6 pg (ref 26.0–34.0)
MCHC: 34.6 g/dL (ref 30.0–36.0)
MCV: 88.3 fL (ref 78.0–100.0)
Platelets: 182 10*3/uL (ref 150–400)
RDW: 12.2 % (ref 11.5–15.5)
WBC: 5.1 10*3/uL (ref 4.0–10.5)

## 2013-09-16 MED ORDER — WARFARIN SODIUM 5 MG PO TABS
5.0000 mg | ORAL_TABLET | Freq: Once | ORAL | Status: DC
  Filled 2013-09-16: qty 1

## 2013-09-16 MED ORDER — HEPARIN (PORCINE) IN NACL 100-0.45 UNIT/ML-% IJ SOLN
1150.0000 [IU]/h | INTRAMUSCULAR | Status: DC
  Administered 2013-09-16: 1150 [IU]/h via INTRAVENOUS
  Filled 2013-09-16 (×3): qty 250

## 2013-09-16 MED ORDER — KETOROLAC TROMETHAMINE 30 MG/ML IJ SOLN: 15.0000 mg | Freq: Four times a day (QID) | INTRAMUSCULAR | Status: DC | PRN

## 2013-09-16 MED ORDER — WARFARIN SODIUM 7.5 MG PO TABS
7.5000 mg | ORAL_TABLET | Freq: Once | ORAL | Status: AC
  Administered 2013-09-16: 7.5 mg via ORAL
  Filled 2013-09-16: qty 1

## 2013-09-16 NOTE — Progress Notes (Signed)
*  PRELIMINARY RESULTS* Echocardiogram 2D Echocardiogram has been performed.  Jeryl Columbia 09/16/2013, 10:31 AM

## 2013-09-16 NOTE — Progress Notes (Signed)
*  PRELIMINARY RESULTS* Echocardiogram 2D Echocardiogram has been performed.  Zuhayr Deeney 09/16/2013, 10:31 AM 

## 2013-09-16 NOTE — Progress Notes (Signed)
ANTICOAGULATION CONSULT NOTE - Follow Up Consult  Pharmacy Consult for Heparin/Warfarin Indication: New PE, DVT  No Known Allergies  Patient Measurements: Height: 5\' 4"  (162.6 cm) Weight: 173 lb 4.8 oz (78.608 kg) IBW/kg (Calculated) : 54.7 Heparin Dosing Weight: 71.5kg  Vital Signs: Temp: 97.5 F (36.4 C) (10/07 0450) Temp src: Oral (10/07 0450) BP: 116/54 mmHg (10/07 0450) Pulse Rate: 60 (10/07 0450)  Labs:  Recent Labs  09/14/13 1801 09/14/13 2059 09/15/13 0440 09/15/13 1045 09/16/13 0425  HGB 12.8  --  10.9*  --  11.0*  HCT 36.9  --  31.8*  --  31.8*  PLT 186  --  164  --  182  APTT  --  34  --   --   --   LABPROT  --  14.6 14.3  --  13.8  INR  --  1.16 1.13  --  1.08  HEPARINUNFRC  --   --  0.63 0.53 0.39  CREATININE 0.65  --  0.61  --   --   TROPONINI <0.30  --   --   --   --     Estimated Creatinine Clearance: 70.2 ml/min (by C-G formula based on Cr of 0.61).   Medications:  Scheduled:  . escitalopram  10 mg Oral Daily  . simvastatin  20 mg Oral QHS  . sodium chloride  3 mL Intravenous Q12H  . Warfarin - Pharmacist Dosing Inpatient   Does not apply q1800   Infusions:  . sodium chloride 100 mL/hr at 09/14/13 2200  . heparin 1,100 Units/hr (09/15/13 1344)  Inpatient warfarin doses: 5mg  (10/5), 5mg  (10/6)  [name] admitted 10/5 with lower back pain and pain with deep inspiration. CTa showed large PE including saddle embolus; doppler shows left DVT.   Day #3 of 5 minimum IV heparin and warfarin  Heparin level therapeutic but decreased on 1100 units/hr  INR unchanged after 2 doses of warfarin 5mg   CBC slightly decreased from baseline but no bleeding/complications reported.  Goal of Therapy:  Heparin level 0.3-0.7 units/ml INR 2-3 Monitor platelets by anticoagulation protocol: Yes   Plan:   Increase heparin slightly to 1150 units/hr - transition to lovenox?  Increase warfarin to 7 5mg  po today - give early at 12  noon  Daily PT/INR, CBC, Heparin level  Educated patient regarding warfarin therapy on 10/6. Cautioned her about excessive use of NSAIDS. She demonstrated excellent understanding  Tiffany Lowe, PharmD, BCPS Pager: [phone] 09/16/2013,7:07 AM

## 2013-09-16 NOTE — Progress Notes (Signed)
[facility] PROGRESS NOTE  Tiffany Lowe MVH:[id number] DOB: 1946-11-24 DOA: 09/14/2013 PCP: Lorretta Harp, MD Brief HPI: Tiffany Lowe is a 67 y.o. female With a hx of hld presents with complaints of lower back pain. In the ED, work up demonstrated findings of a d-dimer of 13 with a large volume acute pulm embolus seen on CTA. The patient was started on therapeutic heparin and the hospitalist was consulted for admission. On further questioning, pt reports hx of intermittent B LE pain treated w/ naproxen as an outpt. She underwent LE dopplers Left: DVT noted from the mid femoral vein through the popliteal, gastrocnemius, posterior tibial, and peroneal veins. Will consult IR to see if she is a candidate for IVC filter placement.    Assessment/Plan: Saddle PE  1. Heparin started in EDand started on  therapeutic coumadin. 2. Cont O2 as needed. Documented o2 sat of 87% in the ED 3. Hypercoag panel has already been drawn in the ED Dopplers show acute left sided DVT. Ordered IR consult for IVC . ECHO ordered.  HLD  1. Cont meds Hypoxia  1. Per above, cont o2 as needed.  Code Status: FULL CODE Family Communication: family at bedside Disposition Plan: pending.   Consultants:  none  Procedures:   CTangio  Antibiotics:  none  HPI/Subjective: Reports back pain.  Objective: Filed Vitals:   09/16/13 1500  BP: 131/47  Pulse: 71  Temp: 98.6 F (37 C)  Resp: 18    Intake/Output Summary (Last 24 hours) at 09/16/13 1757 Last data filed at 09/16/13 1431  Gross per 24 hour  Intake    612 ml  Output    750 ml  Net   -138 ml   Filed Weights   09/14/13 1935 09/14/13 2047 09/14/13 2232  Weight: 78.7 kg (173 lb 8 oz) 78.7 kg (173 lb 8 oz) 78.608 kg (173 lb 4.8 oz)    Exam:   General:  Alert afebriel comfortable  Cardiovascular: s1s2  Respiratory: ctab  Abdomen: soft NT   ND BS+   Data Reviewed: Basic Metabolic Panel:  Recent Labs Lab  09/14/13 1801 09/15/13 0440  NA 137 137  K 3.1* 3.4*  CL 101 104  CO2 25 25  GLUCOSE 144* 134*  BUN 13 11  CREATININE 0.65 0.61  CALCIUM 9.4 8.7   Liver Function Tests:  Recent Labs Lab 09/14/13 1801 09/15/13 0440  AST 20 15  ALT 24 19  ALKPHOS 121* 98  BILITOT 0.8 1.0  PROT 7.4 6.1  ALBUMIN 3.7 3.0*    Recent Labs Lab 09/14/13 1801  LIPASE 19   No results found for this basename: AMMONIA,  in the last 168 hours CBC:  Recent Labs Lab 09/14/13 1801 09/15/13 0440 09/16/13 0425  WBC 8.4 7.0 5.1  NEUTROABS 6.8  --   --   HGB 12.8 10.9* 11.0*  HCT 36.9 31.8* 31.8*  MCV 87.6 87.4 88.3  PLT 186 164 182   Cardiac Enzymes:  Recent Labs Lab 09/14/13 1801  TROPONINI <0.30   BNP (last 3 results) No results found for this basename: PROBNP,  in the last 8760 hours CBG: No results found for this basename: GLUCAP,  in the last 168 hours  No results found for this or any previous visit (from the past 240 hour(s)).   Studies: Ct Angio Chest Pe W/cm &/or Wo Cm  09/14/2013   *RADIOLOGY REPORT*  Clinical Data: Back pain, cough, elevated D-dimer  CT ANGIOGRAPHY CHEST  Technique:  Multidetector CT imaging  of the chest using the standard protocol during bolus administration of intravenous contrast. Multiplanar reconstructed images including MIPs were obtained and reviewed to evaluate the vascular anatomy.  Contrast: OMNIPAQUE IOHEXOL 350 MG/ML SOLN  Comparison: Chest x-ray obtained today at 17 46 the  Findings:  Mediastinum: Heterogeneous right thyroid gland with probable 8 mm nodule.  Nonspecific borderline enlarged mediastinal lymph nodes including an 8 mm short axis prevascular node.  No mediastinal mass.  Unremarkable visualized thoracic esophagus.  Heart/Vascular: Large volume pulmonary embolus including saddle embolus in the right and left main pulmonary arteries.  Emboli extend into the bilateral lower lobe pulmonary arteries bilaterally.  The heart is within normal  limits for size.  The RV/LV ratio is 0.83 which does not to suggest right heart strain. Calcified plaque along the course of the left anterior descending coronary artery consistent with underlying coronary artery disease.  Lungs/Pleura: Patchy airspace opacity in the left lung abutting the pleura consistent with areas of pulmonary infarct versus PE related alveolar hemorrhage.  Mild dependent lower lobe atelectasis bilaterally.  No suspicious pulmonary nodule.  Linear atelectasis versus scarring in the right middle lobe.  Small left pleural effusion.  Upper Abdomen: Circumscribed hypoattenuating lesion in the central aspect of hepatic segment for a measures 1 cm and likely represents a hepatic cyst.  Otherwise, limited imaging of the upper abdomen is unremarkable.  Bones: No acute fracture or aggressive appearing lytic or blastic osseous lesion.  IMPRESSION: 1.  Large volume acute pulmonary embolus including saddle embolus and bilateral lobar, segmental and subsegmental lower lobe emboli with associated left lower lobe pulmonary infarction versus alveolar hemorrhage and small left pleural effusion.  No CT evidence of right heart strain.  The RV/LV ratio is less than 0.9. 2.  Nonspecific, not enlarged by CT criteria mediastinal lymph node is likely reactive. 3.  Atherosclerosis including coronary artery disease.  Critical Value/emergent results were called by telephone at the time of interpretation on 09/14/2013 at 08:15 p.m. to Dr. Clarene Duke, who verbally acknowledged these results.   Original Report Authenticated By: Malachy Moan, M.D.    Scheduled Meds: . escitalopram  10 mg Oral Daily  . simvastatin  20 mg Oral QHS  . sodium chloride  3 mL Intravenous Q12H  . Warfarin - Pharmacist Dosing Inpatient   Does not apply q1800   Continuous Infusions: . sodium chloride 100 mL/hr at 09/16/13 0900  . heparin 1,150 Units/hr (09/16/13 1536)    Principal Problem:   Saddle embolism of pulmonary artery Active  Problems:   HYPERLIPIDEMIA   Hypoxia    Time spent: 25 minutes.    [facility]  [facility] Pager [phone] If 7PM-7AM, please contact night-coverage at [email or web address], password [facility] 09/16/2013, 5:57 PM  LOS: 2 days

## 2013-09-17 DIAGNOSIS — K30 Functional dyspepsia: Secondary | ICD-10-CM

## 2013-09-17 DIAGNOSIS — K219 Gastro-esophageal reflux disease without esophagitis: Secondary | ICD-10-CM

## 2013-09-17 DIAGNOSIS — D649 Anemia, unspecified: Secondary | ICD-10-CM

## 2013-09-17 LAB — CBC
HCT: 30.2 % — ABNORMAL LOW (ref 36.0–46.0)
MCHC: 34.4 g/dL (ref 30.0–36.0)
MCV: 87.8 fL (ref 78.0–100.0)
RDW: 12 % (ref 11.5–15.5)

## 2013-09-17 LAB — HEPARIN LEVEL (UNFRACTIONATED): Heparin Unfractionated: 0.32 IU/mL (ref 0.30–0.70)

## 2013-09-17 MED ORDER — PANTOPRAZOLE SODIUM 40 MG PO TBEC
40.0000 mg | DELAYED_RELEASE_TABLET | Freq: Every day | ORAL | Status: DC
  Administered 2013-09-17 – 2013-09-19 (×3): 40 mg via ORAL
  Filled 2013-09-17 (×2): qty 1

## 2013-09-17 MED ORDER — ZOLPIDEM TARTRATE 5 MG PO TABS
5.0000 mg | ORAL_TABLET | Freq: Once | ORAL | Status: AC
  Administered 2013-09-17: 5 mg via ORAL
  Filled 2013-09-17: qty 1

## 2013-09-17 MED ORDER — ENOXAPARIN (LOVENOX) PATIENT EDUCATION KIT
PACK | Freq: Once | Status: AC
  Administered 2013-09-17: 09:00:00
  Filled 2013-09-17: qty 1

## 2013-09-17 MED ORDER — ENOXAPARIN SODIUM 80 MG/0.8ML ~~LOC~~ SOLN
80.0000 mg | Freq: Two times a day (BID) | SUBCUTANEOUS | Status: DC
  Administered 2013-09-17 – 2013-09-19 (×5): 80 mg via SUBCUTANEOUS
  Filled 2013-09-17 (×6): qty 0.8

## 2013-09-17 MED ORDER — ONDANSETRON HCL 4 MG/2ML IJ SOLN
4.0000 mg | Freq: Four times a day (QID) | INTRAMUSCULAR | Status: DC | PRN
  Administered 2013-09-18: 4 mg via INTRAVENOUS
  Filled 2013-09-17 (×2): qty 2

## 2013-09-17 MED ORDER — GI COCKTAIL ~~LOC~~
30.0000 mL | Freq: Three times a day (TID) | ORAL | Status: DC | PRN
  Filled 2013-09-17: qty 30

## 2013-09-17 MED ORDER — WARFARIN SODIUM 7.5 MG PO TABS
7.5000 mg | ORAL_TABLET | Freq: Once | ORAL | Status: AC
  Administered 2013-09-17: 18:00:00 7.5 mg via ORAL
  Filled 2013-09-17: qty 1

## 2013-09-17 NOTE — Progress Notes (Signed)
[facility] PROGRESS NOTE  Tiffany Lowe ZOX:[id number] DOB: 06-20-1946 DOA: 09/14/2013 PCP: Lorretta Harp, MD  Assessment/Plan  Saddle PE s/p 2 days of IV heparin.  Troponin was negative.  ECHO demonstrated mild TR and peak PA pressure -  Transition to lovenox, patient to undergo teaching on administration -  Continue coumadin -  INR appointment scheduled at her PCP's office on 10/13 at 11:45 AM. -  PCP to follow up on hypercoag panel  -  Patient off oxygen, blood pressures stable, tolerating anticoagulation.  Agree that there is no indication for IVC filter at this time.   -  PCP to perform rigorous cancer screening at outpatient appointment  HLD, stable.  Continue statin.   Heartburn, indigestion -  Zofran prn -  Start protonix -  GI cocktail prn  Normocytic anemia, may be related to large clot burden.   -  Monitor for signs of bleeding  Diet:  regular Access:  PIV IVF:  OFF Proph:  Therapeutic lovenox  Code Status: full Family Communication: patient alone Disposition Plan: pending lovenox teaching.  Home tomorrow morning after next INR check   Consultants:  Pharmacy  Radiology  Procedures:  CT angio chest  Antibiotics:  None   HPI/Subjective:  States she had some indigestion with regurgitation this morning and does not feel like eating breakfast.      Objective: Filed Vitals:   09/16/13 1500 09/16/13 1858 09/16/13 2021 09/17/13 0449  BP: 131/47  115/47 131/51  Pulse: 71  69 68  Temp: 98.6 F (37 C)  98.1 F (36.7 C) 98.1 F (36.7 C)  TempSrc: Oral  Oral Oral  Resp: 18  18 18   Height:      Weight:      SpO2: 100% 93% 92% 95%    Intake/Output Summary (Last 24 hours) at 09/17/13 1516 Last data filed at 09/17/13 0800  Gross per 24 hour  Intake 1729.8 ml  Output    300 ml  Net 1429.8 ml   Filed Weights   09/14/13 1935 09/14/13 2047 09/14/13 2232  Weight: 78.7 kg (173 lb 8 oz) 78.7 kg (173 lb 8 oz) 78.608 kg (173 lb 4.8  oz)    Exam:   General:  Adult female No acute distress  HEENT:  NCAT, MMM  Cardiovascular:  RRR, nl S1, S2 no mrg, 2+ pulses, warm extremities  Respiratory:  CTAB, no increased WOB  Abdomen:   NABS, soft, NT/ND  MSK:   Normal tone and bulk, no LEE  Neuro:  Grossly intact  Data Reviewed: Basic Metabolic Panel:  Recent Labs Lab 09/14/13 1801 09/15/13 0440  NA 137 137  K 3.1* 3.4*  CL 101 104  CO2 25 25  GLUCOSE 144* 134*  BUN 13 11  CREATININE 0.65 0.61  CALCIUM 9.4 8.7   Liver Function Tests:  Recent Labs Lab 09/14/13 1801 09/15/13 0440  AST 20 15  ALT 24 19  ALKPHOS 121* 98  BILITOT 0.8 1.0  PROT 7.4 6.1  ALBUMIN 3.7 3.0*    Recent Labs Lab 09/14/13 1801  LIPASE 19   No results found for this basename: AMMONIA,  in the last 168 hours CBC:  Recent Labs Lab 09/14/13 1801 09/15/13 0440 09/16/13 0425 09/17/13 0341  WBC 8.4 7.0 5.1 5.5  NEUTROABS 6.8  --   --   --   HGB 12.8 10.9* 11.0* 10.4*  HCT 36.9 31.8* 31.8* 30.2*  MCV 87.6 87.4 88.3 87.8  PLT 186 164 182 213  Cardiac Enzymes:  Recent Labs Lab 09/14/13 1801  TROPONINI <0.30   BNP (last 3 results) No results found for this basename: PROBNP,  in the last 8760 hours CBG: No results found for this basename: GLUCAP,  in the last 168 hours  No results found for this or any previous visit (from the past 240 hour(s)).   Studies: No results found.  Scheduled Meds: . enoxaparin (LOVENOX) injection  80 mg Subcutaneous BID  . escitalopram  10 mg Oral Daily  . simvastatin  20 mg Oral QHS  . sodium chloride  3 mL Intravenous Q12H  . warfarin  7.5 mg Oral ONCE-1800  . Warfarin - Pharmacist Dosing Inpatient   Does not apply q1800   Continuous Infusions:   Principal Problem:   Saddle embolism of pulmonary artery Active Problems:   HYPERLIPIDEMIA   Hypoxia    Time spent: 30 min    Tiffany Lowe, [facility]  [facility] Pager [phone]. If 7PM-7AM, please contact  night-coverage at [email or web address], password [facility] 09/17/2013, 3:16 PM  LOS: 3 days

## 2013-09-17 NOTE — Progress Notes (Signed)
Subjective: Patient was seen and examined today. She states she presented with main c/o left back pain worse with inspiration 10/10 that started Thursday evening and worsened Friday and Saturday evening, left back pain is now 1/10. She denies any shortness of breath or chest pain. She is currently 100% on RA. Patient denies any LE pain or swelling at this time. She did have a CTA on admission which revealed large volume pulmonary embolus including saddle embolus and bilateral lobar, segmental and subsegmental lower lobe emboli. Patient was placed on IV heparin and warfarin. Bilateral LE venous duplex performed left: DVT noted mid femoral vein through popliteal, gastrocnemius, posterior tibial, and peroneal veins. Right: no evidence of DVT. She denies any known history of DVT/PE. She denies any history of bleeding, any active blood in her stool or urine. She denies any fall risk. She denies any history of hemorrhagic stroke.   Objective: Physical Exam: BP 131/51  Pulse 68  Temp(Src) 98.1 F (36.7 C) (Oral)  Resp 18  Ht 5\' 4"  (1.626 m)  Wt 173 lb 4.8 oz (78.608 kg)  BMI 29.73 kg/m2  SpO2 95%  General: A&Ox3, NAD Heart: RRR without M/G/R Lungs: CTA bilaterally. Abd: Soft, NT, ND (+) BS Ext: without edema or pain to movement. DP intact bilaterally 2+  Labs: CBC  Recent Labs  09/16/13 0425 09/17/13 0341  WBC 5.1 5.5  HGB 11.0* 10.4*  HCT 31.8* 30.2*  PLT 182 213   BMET  Recent Labs  09/14/13 1801 09/15/13 0440  NA 137 137  K 3.1* 3.4*  CL 101 104  CO2 25 25  GLUCOSE 144* 134*  BUN 13 11  CREATININE 0.65 0.61  CALCIUM 9.4 8.7   LFT  Recent Labs  09/14/13 1801 09/15/13 0440  PROT 7.4 6.1  ALBUMIN 3.7 3.0*  AST 20 15  ALT 24 19  ALKPHOS 121* 98  BILITOT 0.8 1.0  LIPASE 19  --    PT/INR  Recent Labs  09/16/13 0425 09/17/13 0341  LABPROT 13.8 14.9  INR 1.08 1.20    Studies/Results: No results found.  Assessment/Plan: Acute large volume PE CTA on  09/14/13 on IV heparin and warfarin with stable labs and vitals, no active bleeding. Left DVT duplex 09/15/13. Discussed case and images with Dr. Miles Costain who also also examined the patient today and feels at this time no indication for IVC filter placement. Continue anticoagulation and monitor VS, O2 and patient's status.     LOS: 3 days    Tiffany Lowe 09/17/2013 9:34 AM

## 2013-09-17 NOTE — Progress Notes (Signed)
Pt had 3 episodes today of "spitting up" fluids and medications after swallowing them.  Pt states that it "sits in her throat until it comes back up".  Pt states that this is probably due to her "GERD". Pt is afebrile. No other c/o GI symptoms. Pt refuses GI medications at this time.   Lovenox injections explained/demonstrated to pt and "kit" given.  Pt to give herself her tonight injection.

## 2013-09-17 NOTE — Progress Notes (Signed)
ANTICOAGULATION CONSULT NOTE - Initial Consult  Pharmacy Consult for Lovenox, Coumadin Indication: new PE/DVT  No Known Allergies  Patient Measurements: Height: 5\' 4"  (162.6 cm) Weight: 173 lb 4.8 oz (78.608 kg) IBW/kg (Calculated) : 54.7  Labs:  Recent Labs  09/14/13 1801  09/14/13 2059  09/15/13 0440 09/15/13 1045 09/16/13 0425 09/17/13 0341  HGB 12.8  --   --   --  10.9*  --  11.0* 10.4*  HCT 36.9  --   --   --  31.8*  --  31.8* 30.2*  PLT 186  --   --   --  164  --  182 213  APTT  --   --  34  --   --   --   --   --   LABPROT  --   < > 14.6  --  14.3  --  13.8 14.9  INR  --   < > 1.16  --  1.13  --  1.08 1.20  HEPARINUNFRC  --   --   --   < > 0.63 0.53 0.39 0.32  CREATININE 0.65  --   --   --  0.61  --   --   --   TROPONINI <0.30  --   --   --   --   --   --   --   < > = values in this interval not displayed.  Estimated Creatinine Clearance: 70.2 ml/min (by C-G formula based on Cr of 0.61).   Assessment: 14 yoF admitted 10/5 with large volume acute saddle PE and L DVT. IV Heparin and Coumadin started 10/5.  Today is D#4 of overlap and MD would like to transition IV heparin to sq Lovenox. Hypercoag panel wnl.   Coumadin score = 3 but INR not responding to recent Coumadin doses.  INR 1.2 this AM. Hgb 10.4, plts wnl, Scr wnl for CG CrCl 70 ml/min, pt weighs 79kg. Coumadin education completed 10/6.   Goal of Therapy:  INR 2-3 Anti-Xa level 0.6-1.2 units/ml 4hrs after LMWH dose given Monitor platelets by anticoagulation protocol: Yes   Plan:   Stop IV heparin and associated labs  After 1 hour of IV heparin discontinuation, start Lovenox 80 mg sq q12h  Coumadin 7.5mg  tonight  Daily PT/INR  Geoffry Paradise, PharmD, BCPS Pager: [phone] 8:01 AM Pharmacy #: [id number]

## 2013-09-18 DIAGNOSIS — K3189 Other diseases of stomach and duodenum: Secondary | ICD-10-CM

## 2013-09-18 DIAGNOSIS — K219 Gastro-esophageal reflux disease without esophagitis: Secondary | ICD-10-CM

## 2013-09-18 LAB — PROTIME-INR: INR: 1.3 (ref 0.00–1.49)

## 2013-09-18 MED ORDER — WARFARIN SODIUM 10 MG PO TABS
10.0000 mg | ORAL_TABLET | Freq: Once | ORAL | Status: AC
  Administered 2013-09-18: 10 mg via ORAL
  Filled 2013-09-18: qty 1

## 2013-09-18 MED ORDER — SUCRALFATE 1 GM/10ML PO SUSP
1.0000 g | Freq: Three times a day (TID) | ORAL | Status: DC
  Administered 2013-09-18 – 2013-09-19 (×4): 1 g via ORAL
  Filled 2013-09-18 (×7): qty 10

## 2013-09-18 MED ORDER — ZOLPIDEM TARTRATE 5 MG PO TABS
5.0000 mg | ORAL_TABLET | Freq: Once | ORAL | Status: AC
  Administered 2013-09-18: 22:00:00 5 mg via ORAL
  Filled 2013-09-18: qty 1

## 2013-09-18 NOTE — Progress Notes (Signed)
[facility] PROGRESS NOTE  Tiffany Lowe AVW:[id number] DOB: Apr 02, 1946 DOA: 09/14/2013 PCP: Lorretta Harp, MD  Assessment/Plan  Saddle PE s/p 2 days of IV heparin.  Troponin was negative.  ECHO demonstrated mild TR and peak PA pressure -  Continue lovenox and coumadin -  INR appointment scheduled at her PCP's office on 10/13 at 11:45 AM. -  PCP to follow up on hypercoag panel and perform rigorous cancer screening at outpatient appointment  HLD, stable.  Continue statin.   Heartburn, indigestion and some blood streaked emesis, not tolerating much food or liquid -  Zofran prn -  Continue BID protonix -  GI cocktail prn -  Start carafate  Normocytic anemia, may be related to large clot burden.   -  Monitor for signs of bleeding  Diet:  regular Access:  PIV IVF:  OFF Proph:  Therapeutic lovenox  Code Status: full Family Communication: patient alone Disposition Plan: pending lovenox teaching.  Home tomorrow morning if nausea and vomiting improving   Consultants:  Pharmacy  Radiology  Procedures:  CT angio chest  Antibiotics:  None   HPI/Subjective:  States she had some blood streaked emesis today.  Not able to eat breakfast.    Objective: Filed Vitals:   09/17/13 1520 09/17/13 2111 09/18/13 0600 09/18/13 1348  BP: 146/75 140/56 122/60 130/65  Pulse: 82 81 70 72  Temp: 97.9 F (36.6 C) 98.2 F (36.8 C) 97.8 F (36.6 C) 97.8 F (36.6 C)  TempSrc: Oral Oral Oral   Resp: 19 18 18 16   Height:      Weight:      SpO2: 93% 95% 92% 95%    Intake/Output Summary (Last 24 hours) at 09/18/13 1411 Last data filed at 09/17/13 1522  Gross per 24 hour  Intake    240 ml  Output      0 ml  Net    240 ml   Filed Weights   09/14/13 1935 09/14/13 2047 09/14/13 2232  Weight: 78.7 kg (173 lb 8 oz) 78.7 kg (173 lb 8 oz) 78.608 kg (173 lb 4.8 oz)    Exam:   General:  Adult female No acute distress  HEENT:  NCAT, MMM  Cardiovascular:  RRR,  nl S1, S2 no mrg, 2+ pulses, warm extremities  Respiratory:  CTAB, no increased WOB  Abdomen:   NABS, soft, NT/ND  MSK:   Normal tone and bulk, no LEE  Neuro:  Grossly intact  Data Reviewed: Basic Metabolic Panel:  Recent Labs Lab 09/14/13 1801 09/15/13 0440  NA 137 137  K 3.1* 3.4*  CL 101 104  CO2 25 25  GLUCOSE 144* 134*  BUN 13 11  CREATININE 0.65 0.61  CALCIUM 9.4 8.7   Liver Function Tests:  Recent Labs Lab 09/14/13 1801 09/15/13 0440  AST 20 15  ALT 24 19  ALKPHOS 121* 98  BILITOT 0.8 1.0  PROT 7.4 6.1  ALBUMIN 3.7 3.0*    Recent Labs Lab 09/14/13 1801  LIPASE 19   No results found for this basename: AMMONIA,  in the last 168 hours CBC:  Recent Labs Lab 09/14/13 1801 09/15/13 0440 09/16/13 0425 09/17/13 0341  WBC 8.4 7.0 5.1 5.5  NEUTROABS 6.8  --   --   --   HGB 12.8 10.9* 11.0* 10.4*  HCT 36.9 31.8* 31.8* 30.2*  MCV 87.6 87.4 88.3 87.8  PLT 186 164 182 213   Cardiac Enzymes:  Recent Labs Lab 09/14/13 1801  TROPONINI <0.30  BNP (last 3 results) No results found for this basename: PROBNP,  in the last 8760 hours CBG: No results found for this basename: GLUCAP,  in the last 168 hours  No results found for this or any previous visit (from the past 240 hour(s)).   Studies: No results found.  Scheduled Meds: . enoxaparin (LOVENOX) injection  80 mg Subcutaneous BID  . escitalopram  10 mg Oral Daily  . pantoprazole  40 mg Oral Daily  . simvastatin  20 mg Oral QHS  . sodium chloride  3 mL Intravenous Q12H  . sucralfate  1 g Oral TID WC & HS  . warfarin  10 mg Oral ONCE-1800  . Warfarin - Pharmacist Dosing Inpatient   Does not apply q1800   Continuous Infusions:   Principal Problem:   Saddle embolism of pulmonary artery Active Problems:   HYPERLIPIDEMIA   Hypoxia   Normocytic anemia   Acid reflux   Indigestion    Time spent: 30 min    Tiffany Lowe  [facility] Pager [phone]. If 7PM-7AM, please  contact night-coverage at [email or web address], password [facility] 09/18/2013, 2:11 PM  LOS: 4 days

## 2013-09-18 NOTE — Progress Notes (Signed)
Patient vomited X2 noted small streaks of blood notified pharmacist-Thyvan and Dr. Malachi Bonds and they is okay to continue giving Lovenox and coumadin. Re-educate patient to notify staff of any bleeding. Zofran given for N/V and was effective. Will continue to assess patient.

## 2013-09-18 NOTE — Progress Notes (Signed)
Spoke with pt concerning PCP and insurance. PCP is Dr. Fabian Sharp, [employer]. No discharge needs at present time.

## 2013-09-18 NOTE — Progress Notes (Addendum)
ANTICOAGULATION CONSULT NOTE -  Follow Up Consult  Pharmacy Consult for Lovenox, Coumadin Indication: new PE/DVT  No Known Allergies  Patient Measurements: Height: 5\' 4"  (162.6 cm) Weight: 173 lb 4.8 oz (78.608 kg) IBW/kg (Calculated) : 54.7  Labs:  Recent Labs  09/15/13 1045 09/16/13 0425 09/17/13 0341 09/18/13 0400  HGB  --  11.0* 10.4*  --   HCT  --  31.8* 30.2*  --   PLT  --  182 213  --   LABPROT  --  13.8 14.9 15.9*  INR  --  1.08 1.20 1.30  HEPARINUNFRC 0.53 0.39 0.32  --     Estimated Creatinine Clearance: 70.2 ml/min (by C-G formula based on Cr of 0.61).   Assessment: 52 yoF admitted 10/5 with large volume acute saddle PE and L DVT. IV Heparin and Coumadin started 10/5, IV heparin was transitioned to Lovenox full dose 10/7. Today is D#5 of overlap with Coumadin. Hypercoag panel wnl.   Coumadin score = 3, but INR is slow to response to higher than recommended Coumadin dose. INR 1.3 this AM. Hgb 10.4 on 10/7, plts wnl, Scr wnl for CG CrCl 70 ml/min, pt weighs 79kg.   RN reported a few streaks of blood from pt's vomitus this morning, MD notified and plan is to continue Lovenox/Coumadin with close monitoring  Coumadin education completed 10/6.   Goal of Therapy:  INR 2-3 Anti-Xa level 0.6-1.2 units/ml 4hrs after LMWH dose given Monitor platelets by anticoagulation protocol: Yes   Plan:   Continue Lovenox 80 mg sq q12h  Coumadin 10 mg tonight as boosted dose.  IF patient is ready for discharge - please send patient out on Coumadin 7.5mg  po daily with close PT/INR follow up.  F/u bleeding  Daily PT/INR  Geoffry Paradise, PharmD, BCPS Pager: [phone] 10:17 AM Pharmacy #: [id number]

## 2013-09-19 DIAGNOSIS — E785 Hyperlipidemia, unspecified: Secondary | ICD-10-CM

## 2013-09-19 LAB — CBC
HCT: 30.3 % — ABNORMAL LOW (ref 36.0–46.0)
Hemoglobin: 10.5 g/dL — ABNORMAL LOW (ref 12.0–15.0)
MCV: 85.8 fL (ref 78.0–100.0)
Platelets: 289 10*3/uL (ref 150–400)
RBC: 3.53 MIL/uL — ABNORMAL LOW (ref 3.87–5.11)
WBC: 4.9 10*3/uL (ref 4.0–10.5)

## 2013-09-19 MED ORDER — WARFARIN SODIUM 5 MG PO TABS: 7.5000 mg | ORAL_TABLET | Freq: Every day | ORAL | Status: DC

## 2013-09-19 MED ORDER — OMEPRAZOLE 40 MG PO CPDR: 40.0000 mg | DELAYED_RELEASE_CAPSULE | Freq: Every day | ORAL | Status: DC

## 2013-09-19 MED ORDER — ENOXAPARIN SODIUM 120 MG/0.8ML ~~LOC~~ SOLN: 120.0000 mg | Freq: Every day | SUBCUTANEOUS | Status: DC

## 2013-09-19 MED ORDER — PROMETHAZINE HCL 12.5 MG PO TABS: 12.5000 mg | ORAL_TABLET | Freq: Four times a day (QID) | ORAL | Status: DC | PRN

## 2013-09-19 NOTE — Progress Notes (Signed)
ANTICOAGULATION CONSULT NOTE -  Follow Up Consult  Pharmacy Consult for Lovenox, Coumadin Indication: new PE/DVT  No Known Allergies  Patient Measurements: Height: 5\' 4"  (162.6 cm) Weight: 173 lb 4.8 oz (78.608 kg) IBW/kg (Calculated) : 54.7  Labs:  Recent Labs  09/17/13 0341 09/18/13 0400 09/19/13 0407  HGB 10.4*  --  10.5*  HCT 30.2*  --  30.3*  PLT 213  --  289  LABPROT 14.9 15.9* 18.5*  INR 1.20 1.30 1.59*  HEPARINUNFRC 0.32  --   --     Estimated Creatinine Clearance: 70.2 ml/min (by C-G formula based on Cr of 0.61).   Assessment: 33 yoF admitted 10/5 with large volume acute saddle PE and L DVT. IV Heparin and Coumadin started 10/5, IV heparin was transitioned to Lovenox full dose 10/7. Today is D#6 of overlap with Coumadin. Hypercoag panel wnl.   Coumadin score = 3, but INR is slow to response to higher than recommended Coumadin dose. INR 1.59 this AM. Hgb low but stable, plts wnl, Scr wnl for CG CrCl 70 ml/min, pt weighs 79kg.   RN reported a few streaks of blood from pt's vomitus 10/10 (resolved). Coumadin education completed 10/6. Plan discharge today on Coumadin and Lovenox once daily dosing (case manager to work on obtaining Lovenox for discharge as patient is uninsured).  Goal of Therapy:  INR 2-3 Anti-Xa level 0.6-1.2 units/ml 4hrs after LMWH dose given Monitor platelets by anticoagulation protocol: Yes   Plan:   At discharge today, would continue Coumadin 7.5mg  po daily and Lovenox 120 mg sq q24h.    Patient will have PT/INR check on 10/13.  Would d/c Lovenox once INR > 2 for >24 hours.   Pharmacy will not enter tonight's Coumadin dose.  RN to call pharmacy if patient not discharged by [facility]  Geoffry Paradise, PharmD, BCPS Pager: [phone] 10:25 AM Pharmacy #: [id number]

## 2013-09-19 NOTE — Discharge Summary (Signed)
Physician Discharge Summary  Tiffany Lowe:[id number] DOB: 1946-03-03 DOA: 09/14/2013  PCP: Lorretta Harp, MD  Admit date: 09/14/2013 Discharge date: 09/19/2013  Recommendations for Outpatient Follow-up:  1. Follow up with primary care doctor's office on Monday for INR and CBC 2. Continue phenergan and omeprazole for indigestion 3. Coumadin 7.5mg  nightly and lovenox 120mg  injected daily until Monday follow up appointment  Discharge Diagnoses:  Principal Problem:   Saddle embolism of pulmonary artery Active Problems:   HYPERLIPIDEMIA   Hypoxia   Normocytic anemia   Acid reflux   Indigestion   Discharge Condition: stable, improved  Diet recommendation: healthy heart  Wt Readings from Last 3 Encounters:  09/14/13 78.608 kg (173 lb 4.8 oz)  04/08/13 78.019 kg (172 lb)  01/15/13 76.658 kg (169 lb)    History of present illness:  Tiffany Lowe is a 67 y.o. female  With a hx of hld presents with complaints of lower back pain. In the ED, work up demonstrated findings of a d-dimer of 13 with a large volume acute pulm embolus seen on CTA. The patient was started on therapeutic heparin and the hospitalist was consulted for admission. On further questioning, pt reports hx of intermittent B LE pain treated w/ naproxen as an outpt. Currently denies LE swelling.  Hospital Course:   Tiffany Lowe was diagnosed with a saddle pulmonary embolism.  She was started on IV heparin which was continued for two days prior to starting coumadin and transitioning to lovenox.  Her screening troponin was negative and her ECHO demonstrated mild TR and a peak PA pressure of , however, there was no right heart dilation or evidence of strain.  She has been given lovenox 80mg  BID during her stay but will transition to once daily 120mg  for ease of administration.  Case management has provided a match card.  Her next dose is due at [facility] tonight.  She has been given coumadin 7.5mg , 7.5mg , 10mg .  She  should take 7.5mg  daily until she follows up with her doctor.  Her INR today is 1.56.    Heartburn and indigestion with emesis.  She was started on BID protonix which helped a little, however, her symptoms became dramatically better after starting carafate.  She should continue PPI at home and may use mylanta for breakthrough symptoms.  Given Rx for phenergan also.    Normocytic anemia, may be related to large clot burden.  PCP to repeat CBC as outpatient in a few weeks.    HLD, stable.  Continued statin.    HLD, stable.  Continued statin    Consultants:  Pharmacy  Radiology Procedures:  CT angio chest Antibiotics:  None    Discharge Exam: Filed Vitals:   09/19/13 0507  BP: 138/65  Pulse: 66  Temp: 97.9 F (36.6 C)  Resp: 18   Filed Vitals:   09/18/13 0600 09/18/13 1348 09/18/13 2134 09/19/13 0507  BP: 122/60 130/65 136/58 138/65  Pulse: 70 72 64 66  Temp: 97.8 F (36.6 C) 97.8 F (36.6 C) 98 F (36.7 C) 97.9 F (36.6 C)  TempSrc: Oral  Oral Oral  Resp: 18 16 16 18   Height:      Weight:      SpO2: 92% 95% 91% 94%    General: Adult female, No acute distress  HEENT: NCAT, MMM  Cardiovascular: RRR, nl S1, S2 no mrg, 2+ pulses, warm extremities  Respiratory: CTAB, no increased WOB  Abdomen: NABS, soft, NT/ND  MSK: Normal tone and bulk, no LEE  Neuro: Grossly intact   Discharge Instructions   Future Appointments Provider Department Dept Phone   09/22/2013 11:45 AM [facility] Coumadin [facility] [place] [phone]       Medication List    ASK your doctor about these medications       escitalopram 10 MG tablet  Commonly known as:  LEXAPRO  Take 10 mg by mouth daily.     ibuprofen 800 MG tablet  Commonly known as:  ADVIL,MOTRIN  Take 800 mg by mouth every 8 (eight) hours as needed for pain.     naproxen sodium 220 MG tablet  Commonly known as:  ANAPROX  Take 220 mg by mouth 2 (two) times daily as needed (pain).     simvastatin 20 MG  tablet  Commonly known as:  ZOCOR  Take 20 mg by mouth at bedtime.           Follow-up Information   Follow up with Lorretta Harp, MD On 09/22/2013. (11:45AM for INR check)    Specialty:  Internal Medicine   Contact information:   [address] [place] [address] [phone]        The results of significant diagnostics from this hospitalization (including imaging, microbiology, ancillary and laboratory) are listed below for reference.    Significant Diagnostic Studies: Dg Chest 2 View  09/14/2013   CLINICAL DATA:  Cough  EXAM: CHEST  2 VIEW  COMPARISON:  None.  FINDINGS: The cardiac shadow is within normal limits. The lungs are well-aerated with mild left basilar opacity. No other focal infiltrate is seen.  IMPRESSION: Mild left basilar infiltrate.   Electronically Signed   By: Alcide Clever M.D.   On: 09/14/2013 17:57   Ct Angio Chest Pe W/cm &/or Wo Cm  09/14/2013   *RADIOLOGY REPORT*  Clinical Data: Back pain, cough, elevated D-dimer  CT ANGIOGRAPHY CHEST  Technique:  Multidetector CT imaging of the chest using the standard protocol during bolus administration of intravenous contrast. Multiplanar reconstructed images including MIPs were obtained and reviewed to evaluate the vascular anatomy.  Contrast: OMNIPAQUE IOHEXOL 350 MG/ML SOLN  Comparison: Chest x-ray obtained today at 17 46 the  Findings:  Mediastinum: Heterogeneous right thyroid gland with probable 8 mm nodule.  Nonspecific borderline enlarged mediastinal lymph nodes including an 8 mm Tiffany Lowe axis prevascular node.  No mediastinal mass.  Unremarkable visualized thoracic esophagus.  Heart/Vascular: Large volume pulmonary embolus including saddle embolus in the right and left main pulmonary arteries.  Emboli extend into the bilateral lower lobe pulmonary arteries bilaterally.  The heart is within normal limits for size.  The RV/LV ratio is 0.83 which does not to suggest right heart strain. Calcified plaque  along the course of the left anterior descending coronary artery consistent with underlying coronary artery disease.  Lungs/Pleura: Patchy airspace opacity in the left lung abutting the pleura consistent with areas of pulmonary infarct versus PE related alveolar hemorrhage.  Mild dependent lower lobe atelectasis bilaterally.  No suspicious pulmonary nodule.  Linear atelectasis versus scarring in the right middle lobe.  Small left pleural effusion.  Upper Abdomen: Circumscribed hypoattenuating lesion in the central aspect of hepatic segment for a measures 1 cm and likely represents a hepatic cyst.  Otherwise, limited imaging of the upper abdomen is unremarkable.  Bones: No acute fracture or aggressive appearing lytic or blastic osseous lesion.  IMPRESSION: 1.  Large volume acute pulmonary embolus including saddle embolus and bilateral lobar, segmental and subsegmental lower lobe emboli with associated left  lower lobe pulmonary infarction versus alveolar hemorrhage and small left pleural effusion.  No CT evidence of right heart strain.  The RV/LV ratio is less than 0.9. 2.  Nonspecific, not enlarged by CT criteria mediastinal lymph node is likely reactive. 3.  Atherosclerosis including coronary artery disease.  Critical Value/emergent results were called by telephone at the time of interpretation on 09/14/2013 at 08:15 p.m. to Dr. Clarene Duke, who verbally acknowledged these results.   Original Report Authenticated By: Malachy Moan, M.D.    Microbiology: No results found for this or any previous visit (from the past 240 hour(s)).   Labs: Basic Metabolic Panel:  Recent Labs Lab 09/14/13 1801 09/15/13 0440  NA 137 137  K 3.1* 3.4*  CL 101 104  CO2 25 25  GLUCOSE 144* 134*  BUN 13 11  CREATININE 0.65 0.61  CALCIUM 9.4 8.7   Liver Function Tests:  Recent Labs Lab 09/14/13 1801 09/15/13 0440  AST 20 15  ALT 24 19  ALKPHOS 121* 98  BILITOT 0.8 1.0  PROT 7.4 6.1  ALBUMIN 3.7 3.0*    Recent  Labs Lab 09/14/13 1801  LIPASE 19   No results found for this basename: AMMONIA,  in the last 168 hours CBC:  Recent Labs Lab 09/14/13 1801 09/15/13 0440 09/16/13 0425 09/17/13 0341 09/19/13 0407  WBC 8.4 7.0 5.1 5.5 4.9  NEUTROABS 6.8  --   --   --   --   HGB 12.8 10.9* 11.0* 10.4* 10.5*  HCT 36.9 31.8* 31.8* 30.2* 30.3*  MCV 87.6 87.4 88.3 87.8 85.8  PLT 186 164 182 213 289   Cardiac Enzymes:  Recent Labs Lab 09/14/13 1801  TROPONINI <0.30   BNP: BNP (last 3 results) No results found for this basename: PROBNP,  in the last 8760 hours CBG: No results found for this basename: GLUCAP,  in the last 168 hours  Time coordinating discharge: 45 minutes  Signed:  Briunna Leicht  [facility] 09/19/2013, 1:01 PM

## 2013-09-19 NOTE — Progress Notes (Signed)
Pt setup with MATCH program for medications. Teach back used.

## 2013-09-22 ENCOUNTER — Ambulatory Visit (INDEPENDENT_AMBULATORY_CARE_PROVIDER_SITE_OTHER): Payer: 59 | Admitting: General Practice

## 2013-09-22 DIAGNOSIS — I2692 Saddle embolus of pulmonary artery without acute cor pulmonale: Secondary | ICD-10-CM

## 2013-09-22 DIAGNOSIS — Z7901 Long term (current) use of anticoagulants: Secondary | ICD-10-CM

## 2013-09-22 NOTE — Patient Instructions (Signed)

## 2013-09-25 ENCOUNTER — Telehealth: Payer: Self-pay | Admitting: General Practice

## 2013-09-25 ENCOUNTER — Telehealth: Payer: Self-pay | Admitting: Internal Medicine

## 2013-09-25 ENCOUNTER — Telehealth: Payer: Self-pay | Admitting: Family Medicine

## 2013-09-25 NOTE — Telephone Encounter (Signed)
Patient called concerned about tingling in her feet and legs. Patient wanted to know if this is a side effect of coumadin.  I informed patient that it could be a side effect of coumadin and to watch for unusual bruising.  I informed patient to contact Dr. Fabian Sharp if symptoms get worse.  Patient verbalized understanding.

## 2013-09-25 NOTE — Telephone Encounter (Signed)
Opened in error

## 2013-09-25 NOTE — Telephone Encounter (Signed)
Patient Information:  Caller Name: Laurenashley  Phone: [phone]  Patient: Tiffany Lowe, Tiffany Lowe  Gender: Female  DOB: 1946/07/29  Age: 67 Years  PCP: G[facility])  Office Follow Up:  Does the office need to follow up with this patient?: No  Instructions For The Office: N/A  RN Note:  Please call this patient back and advise regarding this being a possible s/e of the Coumadin.  Symptoms  Reason For Call & Symptoms: She had read about side effects of coumadin.  She is having tingling in her legs when she lays down at night to sleep.  She believes the Coumadin is causing this.  Coumadin has just been added to her Medications when she was in the hospital on 09/14/2013.  Reviewed Health History In EMR: Yes  Reviewed Medications In EMR: Yes  Reviewed Allergies In EMR: Yes  Reviewed Surgeries / Procedures: Yes  Date of Onset of Symptoms: 09/23/2013  Guideline(s) Used:  Neurologic Deficit  Disposition Per Guideline:   See Within 3 Days in Office  Reason For Disposition Reached:   Numbness or tingling in one or both feet is a chronic symptom (recurrent or ongoing problem lasting > 4 weeks)  Advice Given:  Call back by 4pm if you have not heard from the office.  Patient Will Follow Care Advice:  YES

## 2013-09-30 ENCOUNTER — Ambulatory Visit (INDEPENDENT_AMBULATORY_CARE_PROVIDER_SITE_OTHER): Payer: 59 | Admitting: Internal Medicine

## 2013-09-30 DIAGNOSIS — I2692 Saddle embolus of pulmonary artery without acute cor pulmonale: Secondary | ICD-10-CM

## 2013-09-30 DIAGNOSIS — Z7901 Long term (current) use of anticoagulants: Secondary | ICD-10-CM

## 2013-10-07 ENCOUNTER — Ambulatory Visit (INDEPENDENT_AMBULATORY_CARE_PROVIDER_SITE_OTHER): Payer: 59 | Admitting: General Practice

## 2013-10-07 DIAGNOSIS — I2692 Saddle embolus of pulmonary artery without acute cor pulmonale: Secondary | ICD-10-CM

## 2013-10-07 DIAGNOSIS — Z7901 Long term (current) use of anticoagulants: Secondary | ICD-10-CM

## 2013-10-07 DIAGNOSIS — Z23 Encounter for immunization: Secondary | ICD-10-CM

## 2013-10-07 LAB — POCT INR: INR: 3.7

## 2013-10-08 ENCOUNTER — Telehealth: Payer: Self-pay | Admitting: Family Medicine

## 2013-10-08 NOTE — Telephone Encounter (Signed)
appt sche °

## 2013-10-08 NOTE — Telephone Encounter (Signed)
This patient has recently been in the hospital for pulmonary embolism.  Dr. Fabian Sharp would like for her to come in.  Please make a 30 minute post hospital follow up.  Thanks!

## 2013-10-14 ENCOUNTER — Ambulatory Visit (INDEPENDENT_AMBULATORY_CARE_PROVIDER_SITE_OTHER): Payer: 59 | Admitting: General Practice

## 2013-10-14 DIAGNOSIS — Z7901 Long term (current) use of anticoagulants: Secondary | ICD-10-CM

## 2013-10-14 DIAGNOSIS — I2692 Saddle embolus of pulmonary artery without acute cor pulmonale: Secondary | ICD-10-CM

## 2013-10-14 LAB — POCT INR: INR: 3.7

## 2013-10-14 NOTE — Progress Notes (Signed)
Pre-visit discussion using our clinic review tool. No additional management support is needed unless otherwise documented below in the visit note.  

## 2013-10-17 ENCOUNTER — Ambulatory Visit (INDEPENDENT_AMBULATORY_CARE_PROVIDER_SITE_OTHER): Payer: 59 | Admitting: Internal Medicine

## 2013-10-17 ENCOUNTER — Encounter: Payer: Self-pay | Admitting: Internal Medicine

## 2013-10-17 VITALS — BP 140/82 | HR 96 | Temp 98.3°F | Wt 172.0 lb

## 2013-10-17 DIAGNOSIS — I82409 Acute embolism and thrombosis of unspecified deep veins of unspecified lower extremity: Secondary | ICD-10-CM

## 2013-10-17 DIAGNOSIS — K219 Gastro-esophageal reflux disease without esophagitis: Secondary | ICD-10-CM

## 2013-10-17 DIAGNOSIS — Z7901 Long term (current) use of anticoagulants: Secondary | ICD-10-CM

## 2013-10-17 DIAGNOSIS — Z09 Encounter for follow-up examination after completed treatment for conditions other than malignant neoplasm: Secondary | ICD-10-CM

## 2013-10-17 DIAGNOSIS — I82402 Acute embolism and thrombosis of unspecified deep veins of left lower extremity: Secondary | ICD-10-CM

## 2013-10-17 DIAGNOSIS — D509 Iron deficiency anemia, unspecified: Secondary | ICD-10-CM

## 2013-10-17 DIAGNOSIS — I2692 Saddle embolus of pulmonary artery without acute cor pulmonale: Secondary | ICD-10-CM

## 2013-10-17 LAB — CBC WITH DIFFERENTIAL/PLATELET
Basophils Absolute: 0 10*3/uL (ref 0.0–0.1)
Hemoglobin: 13.7 g/dL (ref 12.0–15.0)
Lymphocytes Relative: 39.6 % (ref 12.0–46.0)
Monocytes Relative: 10.5 % (ref 3.0–12.0)
Neutro Abs: 2.5 10*3/uL (ref 1.4–7.7)
Neutrophils Relative %: 46.9 % (ref 43.0–77.0)
Platelets: 269 10*3/uL (ref 150.0–400.0)
RDW: 13.2 % (ref 11.5–14.6)
WBC: 5.3 10*3/uL (ref 4.5–10.5)

## 2013-10-17 MED ORDER — OMEPRAZOLE 40 MG PO CPDR: 40.0000 mg | DELAYED_RELEASE_CAPSULE | Freq: Every day | ORAL | Status: DC

## 2013-10-17 NOTE — Progress Notes (Signed)
Chief Complaint  Patient presents with  . Hospitalization Follow-up    PE     HPI: Patient comes in today as follow up from hospitalization for saddle pulm embolus on Oct 5  With neg eval except  Anticardiolipin antibodies     Presented with left upper back pain and some SOB  To [facility] clinic and was referred to ED  doppler  Iin hosp  Was  Pos thrombosis in left . LEDVT   Slight swelling lower left leg no pain . No more chest pain still tired no cough .  Reflux sx better on omeprazole  but  of med at this time . Had "spitting blood "and hb when in hospital  Felt to be from reflux  . Currently eating normally   Had anemia in hosp 10- 11 range and plan to fu as op.   ROS: See pertinent positives and negatives per HPI. No bleeding. 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 Hyperlipidemia   . Depression     mood reactive  . 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 H/O: rheumatic fever     maybe  . Chronic back pain   . Right leg pain     Family History  Problem Relation Age of Onset  . Diverticulitis Mother     with colostomy.   . Alzheimer's disease Father     died 05-10-05  . Coronary artery disease Other   . Hyperlipidemia Other   . Stroke Other     History   Social History  . Marital Status: Married    Spouse Name: N/A    Number of Children: N/A  . Years of Education: N/A   Social History Main Topics  . Smoking status: Never Smoker   . Smokeless tobacco: Never Used  . Alcohol Use: No  . Drug Use: No  . Sexual Activity: None   Other Topics Concern  . 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  Now 4 hours per day   X 5  fmla cause of mom    Husband pipe ets.   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 escitalopram (LEXAPRO) 10 MG tablet Take 10 mg by mouth daily.  Marland Kitchen omeprazole (PRILOSEC) 40 MG capsule Take 1 capsule (40 mg total) by mouth daily.  . promethazine (PHENERGAN) 12.5 MG tablet Take 1 tablet (12.5 mg total) by mouth every 6 (six) hours as needed for nausea.  . simvastatin (ZOCOR) 20 MG tablet Take 20 mg by mouth at bedtime.  Marland Kitchen warfarin (COUMADIN) 5 MG tablet Take 5 mg by mouth daily.  . [DISCONTINUED] omeprazole (PRILOSEC) 40 MG capsule Take 1 capsule (40 mg total) by mouth daily.  . [DISCONTINUED] warfarin (COUMADIN) 5 MG tablet Take 1.5 tablets (7.5 mg total) by mouth daily.  Marland Kitchen enoxaparin (LOVENOX) 120 MG/0.8ML injection Inject 0.8 mLs (120 mg total)  into the skin at bedtime.    EXAM:  BP 140/82  Pulse 96  Temp(Src) 98.3 F (36.8 C)  Wt 172 lb (78.019 kg)  SpO2 97%  Body mass index is 29.51 kg/(m^2).  GENERAL: vitals reviewed and listed above, alert, oriented, appears well hydrated and in no acute distress HEENT: atraumatic, conjunctiva  clear, no obvious abnormalities on inspection of external nose and ears  No hemorrhage slightly muddy inferior  OP : no lesion edema or exudate  NECK: no obvious masses on inspection palpation  No jvd  LUNGS: clear to auscultation bilaterally, no wheezes, rales or rhonchi, good air movement CV: HRRR,  No g or m no clubbing cyanosis or   nl cap refill  Abdomen:  Sof,t normal bowel sounds without hepatosplenomegaly, no guarding rebound or masses no CVA tenderness MS: moves all extremities without noticeable focal  Abnormality ;left  Distal mild swelling  No bruising  Gait nl  PSYCH: pleasant and cooperative, no obvious depression or anxiety  ASSESSMENT AND PLAN:  Discussed the following assessment and plan:  Saddle embolism of pulmonary artery - Plan: warfarin  (COUMADIN) 5 MG tablet, CBC with Differential  Anemia, iron deficiency - Plan: warfarin (COUMADIN) 5 MG tablet, CBC with Differential  Acid reflux - Plan: warfarin (COUMADIN) 5 MG tablet, CBC with Differential  Long term (current) use of anticoagulants - Plan: warfarin (COUMADIN) 5 MG tablet, CBC with Differential  DVT (deep venous thrombosis), left  Hospital discharge follow-up Doing very well although inrs have been up   Goal 2-3 and was told to stay on anitcoag for 6- 12 months . 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  Rx given for compression stockings thigh high 20 - 30  -Patient advised to return or notify health care team  if symptoms worsen or persist or new concerns arise.  Patient Instructions  Stay on the reflux medication  Will write med for this  .  Healthy eating.  Will let you know about the anemia check today.  You may be tired for a while.   ROV in 1-2 month or as needed   Neta Mends. Panosh M.D.

## 2013-10-17 NOTE — Patient Instructions (Addendum)
Stay on the reflux medication  Will write med for this  .  Healthy eating.  Will let you know about the anemia check today.  You may be tired for a while.   ROV in 1-2 month or as needed

## 2013-10-19 DIAGNOSIS — D509 Iron deficiency anemia, unspecified: Secondary | ICD-10-CM | POA: Insufficient documentation

## 2013-10-19 DIAGNOSIS — I82409 Acute embolism and thrombosis of unspecified deep veins of unspecified lower extremity: Secondary | ICD-10-CM | POA: Insufficient documentation

## 2013-10-20 ENCOUNTER — Encounter: Payer: Self-pay | Admitting: Family Medicine

## 2013-10-28 ENCOUNTER — Ambulatory Visit (INDEPENDENT_AMBULATORY_CARE_PROVIDER_SITE_OTHER): Payer: 59 | Admitting: General Practice

## 2013-10-28 DIAGNOSIS — I2692 Saddle embolus of pulmonary artery without acute cor pulmonale: Secondary | ICD-10-CM

## 2013-10-28 DIAGNOSIS — Z7901 Long term (current) use of anticoagulants: Secondary | ICD-10-CM

## 2013-10-28 LAB — POCT INR: INR: 2.6

## 2013-10-28 NOTE — Progress Notes (Signed)
Pre-visit discussion using our clinic review tool. No additional management support is needed unless otherwise documented below in the visit note.  

## 2013-11-18 ENCOUNTER — Ambulatory Visit (INDEPENDENT_AMBULATORY_CARE_PROVIDER_SITE_OTHER): Payer: 59 | Admitting: General Practice

## 2013-11-18 DIAGNOSIS — Z7901 Long term (current) use of anticoagulants: Secondary | ICD-10-CM

## 2013-11-18 DIAGNOSIS — I2692 Saddle embolus of pulmonary artery without acute cor pulmonale: Secondary | ICD-10-CM

## 2013-11-18 NOTE — Progress Notes (Signed)
Pre-visit discussion using our clinic review tool. No additional management support is needed unless otherwise documented below in the visit note.  

## 2013-12-16 ENCOUNTER — Ambulatory Visit (INDEPENDENT_AMBULATORY_CARE_PROVIDER_SITE_OTHER): Payer: 59 | Admitting: General Practice

## 2013-12-16 DIAGNOSIS — I2692 Saddle embolus of pulmonary artery without acute cor pulmonale: Secondary | ICD-10-CM

## 2013-12-16 DIAGNOSIS — Z7901 Long term (current) use of anticoagulants: Secondary | ICD-10-CM

## 2013-12-16 LAB — POCT INR: INR: 2

## 2013-12-16 NOTE — Progress Notes (Signed)
Pre-visit discussion using our clinic review tool. No additional management support is needed unless otherwise documented below in the visit note.  

## 2014-01-13 ENCOUNTER — Ambulatory Visit (INDEPENDENT_AMBULATORY_CARE_PROVIDER_SITE_OTHER): Payer: 59 | Admitting: General Practice

## 2014-01-13 DIAGNOSIS — I2692 Saddle embolus of pulmonary artery without acute cor pulmonale: Secondary | ICD-10-CM

## 2014-01-13 DIAGNOSIS — Z7901 Long term (current) use of anticoagulants: Secondary | ICD-10-CM

## 2014-01-13 DIAGNOSIS — Z5181 Encounter for therapeutic drug level monitoring: Secondary | ICD-10-CM

## 2014-01-13 LAB — POCT INR: INR: 2.4

## 2014-01-13 NOTE — Progress Notes (Signed)
Pre-visit discussion using our clinic review tool. No additional management support is needed unless otherwise documented below in the visit note.  

## 2014-01-29 ENCOUNTER — Other Ambulatory Visit: Payer: Self-pay | Admitting: General Practice

## 2014-01-29 ENCOUNTER — Telehealth: Payer: Self-pay | Admitting: Internal Medicine

## 2014-01-29 DIAGNOSIS — Z7901 Long term (current) use of anticoagulants: Secondary | ICD-10-CM

## 2014-01-29 DIAGNOSIS — I2692 Saddle embolus of pulmonary artery without acute cor pulmonale: Secondary | ICD-10-CM

## 2014-01-29 DIAGNOSIS — D509 Iron deficiency anemia, unspecified: Secondary | ICD-10-CM

## 2014-01-29 DIAGNOSIS — K219 Gastro-esophageal reflux disease without esophagitis: Secondary | ICD-10-CM

## 2014-01-29 MED ORDER — WARFARIN SODIUM 5 MG PO TABS: ORAL_TABLET | ORAL | Status: DC

## 2014-01-29 NOTE — Telephone Encounter (Addendum)
Pt needs refill on coumadin 5 mg#30 w.refills sent to [facility]. Pt is almost out of med

## 2014-02-05 ENCOUNTER — Ambulatory Visit: Payer: 59 | Admitting: Internal Medicine

## 2014-02-11 NOTE — Telephone Encounter (Signed)
Patient needs a refill on escitalopram (LEXAPRO) 10 MG tablet and  simvastatin (ZOCOR) 20 MG tablet. Please send through [facility]. Her contact number has been verified.

## 2014-02-12 MED ORDER — ESCITALOPRAM OXALATE 10 MG PO TABS: 10.0000 mg | ORAL_TABLET | Freq: Every day | ORAL | Status: DC

## 2014-02-12 MED ORDER — SIMVASTATIN 20 MG PO TABS: 20.0000 mg | ORAL_TABLET | Freq: Every day | ORAL | Status: DC

## 2014-02-12 NOTE — Telephone Encounter (Signed)
Pt has upcoming appt.  Medication sent to the pharamcy for 90 day supply no refills.

## 2014-02-24 ENCOUNTER — Ambulatory Visit (INDEPENDENT_AMBULATORY_CARE_PROVIDER_SITE_OTHER): Payer: 59 | Admitting: General Practice

## 2014-02-24 DIAGNOSIS — Z5181 Encounter for therapeutic drug level monitoring: Secondary | ICD-10-CM

## 2014-02-24 DIAGNOSIS — Z7901 Long term (current) use of anticoagulants: Secondary | ICD-10-CM

## 2014-02-24 DIAGNOSIS — I2692 Saddle embolus of pulmonary artery without acute cor pulmonale: Secondary | ICD-10-CM

## 2014-02-24 NOTE — Progress Notes (Signed)
Pre visit review using our clinic review tool, if applicable. No additional management support is needed unless otherwise documented below in the visit note. 

## 2014-02-25 ENCOUNTER — Encounter: Payer: Self-pay | Admitting: Internal Medicine

## 2014-02-25 ENCOUNTER — Ambulatory Visit (INDEPENDENT_AMBULATORY_CARE_PROVIDER_SITE_OTHER): Payer: 59 | Admitting: Internal Medicine

## 2014-02-25 VITALS — BP 140/70 | HR 64 | Temp 97.7°F | Ht 64.25 in | Wt 177.0 lb

## 2014-02-25 DIAGNOSIS — R739 Hyperglycemia, unspecified: Secondary | ICD-10-CM | POA: Insufficient documentation

## 2014-02-25 DIAGNOSIS — R7309 Other abnormal glucose: Secondary | ICD-10-CM

## 2014-02-25 DIAGNOSIS — E785 Hyperlipidemia, unspecified: Secondary | ICD-10-CM

## 2014-02-25 DIAGNOSIS — F4321 Adjustment disorder with depressed mood: Secondary | ICD-10-CM

## 2014-02-25 DIAGNOSIS — Z86711 Personal history of pulmonary embolism: Secondary | ICD-10-CM

## 2014-02-25 DIAGNOSIS — K219 Gastro-esophageal reflux disease without esophagitis: Secondary | ICD-10-CM

## 2014-02-25 DIAGNOSIS — Z7901 Long term (current) use of anticoagulants: Secondary | ICD-10-CM

## 2014-02-25 NOTE — Patient Instructions (Signed)
Get fasting lab appt in the next  1-2 month  For cholesterol etc . Then make preventive visit in May or June   Still try to exercise walk outside is ok.

## 2014-02-25 NOTE — Progress Notes (Signed)
Chief Complaint  Patient presents with  . Follow-up    HPI: Patient comes in today for follow up of  multiple medical problems.   Hx pe anticoagulation never got the compression stockings cause her left leg got better and there is no swelling. No chest pain shortness of breath but she doesn't tend exercise tries a little but doesn't really like it  simvastain  No problems taking   Off acid relux medication    So far doing better    Took  Zantac   Depression ok   ROS: See pertinent positives and negatives per HPI. See above no fever no family history. Could eat better. No unusual bleeding on anticoagulation in range  Past Medical History  Diagnosis Date  . Hyperlipidemia   . Depression     mood reactive  . 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 H/O: rheumatic fever     maybe  . Chronic back pain   . Right leg pain   . 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 Diverticulitis Mother     with colostomy.   . Alzheimer's disease Father     died 2006  . Coronary artery disease Other   . Hyperlipidemia Other   . Stroke Other     History   Social History  . Marital Status: Married    Spouse Name: N/A    Number of Children: N/A  . Years of Education: N/A   Social History Main Topics  . Smoking status: Never Smoker   . Smokeless tobacco: Never Used  . Alcohol Use: No  . Drug Use: No  . Sexual Activity: None   Other Topics Concern  . 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  Now 4 hours per day   X 5 fmla cause of mom    Husband pipe ets.   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 escitalopram (LEXAPRO) 10 MG tablet Take 1 tablet (10 mg total) by mouth daily.  . simvastatin (ZOCOR) 20 MG tablet Take 1 tablet (20 mg total) by mouth at  bedtime.  Marland Kitchen. warfarin (COUMADIN) 5 MG tablet Take as directed by anticoagulation clinic  . [DISCONTINUED] enoxaparin (LOVENOX) 120 MG/0.8ML injection Inject 0.8 mLs (120 mg total) into the skin at bedtime.  . [DISCONTINUED] omeprazole (PRILOSEC) 40 MG capsule Take 1 capsule (40 mg total) by mouth daily.  . [DISCONTINUED] promethazine (PHENERGAN) 12.5 MG tablet Take 1 tablet (12.5 mg total) by mouth every 6 (six) hours as needed for nausea.    EXAM:  BP 140/70  Pulse 64  Temp(Src) 97.7 F (36.5 C) (Oral)  Ht 5' 4.25" (1.632 m)  Wt 177 lb (80.287 kg)  BMI 30.14 kg/m2  SpO2 97%  Body mass index is 30.14 kg/(m^2).  GENERAL: vitals reviewed and listed above, alert, oriented, appears well hydrated and in no acute distress HEENT: atraumatic, conjunctiva  clear, no obvious abnormalities on inspection of external nose and ears NECK: no obvious masses on inspection palpation  LUNGS: clear to auscultation bilaterally, no wheezes, rales or rhonchi, good air movement CV: HRRR, no clubbing cyanosis or  peripheral edema nl cap refill  Vv  left leg slightly larger than right but no acute edema redness or cords. Abdomen soft without organomegaly MS: moves all extremities without noticeable focal  abnormality PSYCH: pleasant and cooperative, no obvious depression or anxiety Lab Results  Component Value Date   WBC 5.3 10/17/2013   HGB 13.7 10/17/2013   HCT 40.9 10/17/2013   PLT 269.0 10/17/2013   GLUCOSE 134* 09/15/2013   CHOL 191 04/08/2013   TRIG 85.0 04/08/2013   HDL 59.40 04/08/2013   LDLDIRECT 190.0 09/30/2010   LDLCALC 115* 04/08/2013   ALT 19 09/15/2013   AST 15 09/15/2013   NA 137 09/15/2013   K 3.4* 09/15/2013   CL 104 09/15/2013   CREATININE 0.61 09/15/2013   BUN 11 09/15/2013   CO2 25 09/15/2013   TSH 0.39 04/08/2013   INR 2.4 01/13/2014   HGBA1C 6.1 04/08/2013    ASSESSMENT AND PLAN:  Discussed the following assessment and plan:  Hyperlipidemia - Plan: Basic metabolic panel, Hemoglobin A1c,  Lipid panel, TSH, Hepatic function panel  Hx pulmonary embolism - saddle embolus no incitor except inactivity? - Plan: Basic metabolic panel, Hemoglobin A1c, Lipid panel, TSH, Hepatic function panel, D-dimer, Quantitative  Long term (current) use of anticoagulants - Plan: Basic metabolic panel, Hemoglobin A1c, Lipid panel, TSH, Hepatic function panel, D-dimer, Quantitative  Adjustment disorder with depressed mood - reports stabiity   Hyperglycemia - Plan: Basic metabolic panel, Hemoglobin A1c, Lipid panel, TSH, Hepatic function panel  Acid reflux - better off prilosec and prn zanatac  -Patient advised to return or notify health care team  if symptoms worsen ,persist or new concerns arise.  Patient Instructions  Get fasting lab appt in the next  1-2 month  For cholesterol etc . Then make preventive visit in May or June   Still try to exercise walk outside is ok.      Neta Mends. Reuven Braver M.D.  Pre visit review using our clinic review tool, if applicable. No additional management support is needed unless otherwise documented below in the visit note.

## 2014-03-03 ENCOUNTER — Telehealth: Payer: Self-pay | Admitting: Internal Medicine

## 2014-03-03 NOTE — Telephone Encounter (Signed)
[facility] is calling needing to discuss the personalize medicine test for warfarin (COUMADIN) 5 MG tablet..Marland Kitchen

## 2014-04-07 ENCOUNTER — Ambulatory Visit (INDEPENDENT_AMBULATORY_CARE_PROVIDER_SITE_OTHER): Payer: 59 | Admitting: General Practice

## 2014-04-07 DIAGNOSIS — I2692 Saddle embolus of pulmonary artery without acute cor pulmonale: Secondary | ICD-10-CM

## 2014-04-07 DIAGNOSIS — Z7901 Long term (current) use of anticoagulants: Secondary | ICD-10-CM

## 2014-04-07 LAB — POCT INR: INR: 3.1

## 2014-04-07 NOTE — Progress Notes (Signed)
Pre visit review using our clinic review tool, if applicable. No additional management support is needed unless otherwise documented below in the visit note. 

## 2014-05-05 ENCOUNTER — Ambulatory Visit (INDEPENDENT_AMBULATORY_CARE_PROVIDER_SITE_OTHER): Payer: 59 | Admitting: Family Medicine

## 2014-05-05 ENCOUNTER — Telehealth: Payer: Self-pay | Admitting: Internal Medicine

## 2014-05-05 DIAGNOSIS — I2692 Saddle embolus of pulmonary artery without acute cor pulmonale: Secondary | ICD-10-CM

## 2014-05-05 DIAGNOSIS — Z5181 Encounter for therapeutic drug level monitoring: Secondary | ICD-10-CM

## 2014-05-05 DIAGNOSIS — Z7901 Long term (current) use of anticoagulants: Secondary | ICD-10-CM

## 2014-05-05 LAB — POCT INR: INR: 2.7

## 2014-05-05 NOTE — Telephone Encounter (Signed)
Pt needs cpx end of June 2015. Can I use Tuesday?

## 2014-05-05 NOTE — Telephone Encounter (Signed)
Absolutely.  Or use Wednesday.  Try not to place back to back with another physical or 30 minute appt.  Thanks!

## 2014-05-06 ENCOUNTER — Other Ambulatory Visit (INDEPENDENT_AMBULATORY_CARE_PROVIDER_SITE_OTHER): Payer: 59

## 2014-05-06 DIAGNOSIS — Z7901 Long term (current) use of anticoagulants: Secondary | ICD-10-CM

## 2014-05-06 DIAGNOSIS — R739 Hyperglycemia, unspecified: Secondary | ICD-10-CM

## 2014-05-06 DIAGNOSIS — E785 Hyperlipidemia, unspecified: Secondary | ICD-10-CM

## 2014-05-06 DIAGNOSIS — R7309 Other abnormal glucose: Secondary | ICD-10-CM

## 2014-05-06 DIAGNOSIS — Z86711 Personal history of pulmonary embolism: Secondary | ICD-10-CM

## 2014-05-06 LAB — CBC WITH DIFFERENTIAL/PLATELET
BASOS ABS: 0 10*3/uL (ref 0.0–0.1)
Basophils Relative: 0.8 % (ref 0.0–3.0)
Eosinophils Absolute: 0.2 10*3/uL (ref 0.0–0.7)
Eosinophils Relative: 4.4 % (ref 0.0–5.0)
HCT: 41.9 % (ref 36.0–46.0)
Hemoglobin: 14 g/dL (ref 12.0–15.0)
LYMPHS ABS: 1.5 10*3/uL (ref 0.7–4.0)
LYMPHS PCT: 38.9 % (ref 12.0–46.0)
MCHC: 33.5 g/dL (ref 30.0–36.0)
MCV: 88.9 fl (ref 78.0–100.0)
MONOS PCT: 10.3 % (ref 3.0–12.0)
Monocytes Absolute: 0.4 10*3/uL (ref 0.1–1.0)
Neutro Abs: 1.8 10*3/uL (ref 1.4–7.7)
Neutrophils Relative %: 45.6 % (ref 43.0–77.0)
PLATELETS: 278 10*3/uL (ref 150.0–400.0)
RBC: 4.71 Mil/uL (ref 3.87–5.11)
RDW: 13.3 % (ref 11.5–15.5)
WBC: 3.9 10*3/uL — ABNORMAL LOW (ref 4.0–10.5)

## 2014-05-06 LAB — HEPATIC FUNCTION PANEL
ALBUMIN: 3.9 g/dL (ref 3.5–5.2)
ALT: 22 U/L (ref 0–35)
AST: 19 U/L (ref 0–37)
Alkaline Phosphatase: 81 U/L (ref 39–117)
Bilirubin, Direct: 0.1 mg/dL (ref 0.0–0.3)
Total Bilirubin: 0.9 mg/dL (ref 0.2–1.2)
Total Protein: 6.8 g/dL (ref 6.0–8.3)

## 2014-05-06 LAB — LIPID PANEL
CHOLESTEROL: 229 mg/dL — AB (ref 0–200)
HDL: 51 mg/dL (ref >39.00)
LDL Cholesterol: 137 mg/dL — ABNORMAL HIGH (ref 0–99)
Total CHOL/HDL Ratio: 4
Triglycerides: 207 mg/dL — ABNORMAL HIGH (ref 0.0–149.0)
VLDL: 41.4 mg/dL — AB (ref 0.0–40.0)

## 2014-05-06 LAB — BASIC METABOLIC PANEL
BUN: 13 mg/dL (ref 6–23)
CO2: 26 mEq/L (ref 19–32)
Calcium: 9.3 mg/dL (ref 8.4–10.5)
Chloride: 106 mEq/L (ref 96–112)
Creatinine, Ser: 0.8 mg/dL (ref 0.4–1.2)
GFR: 77.01 mL/min (ref >60.00)
Glucose, Bld: 93 mg/dL (ref 70–99)
POTASSIUM: 4.1 meq/L (ref 3.5–5.1)
Sodium: 140 mEq/L (ref 135–145)

## 2014-05-06 LAB — TSH: TSH: 0.67 u[IU]/mL (ref 0.35–4.50)

## 2014-05-06 LAB — HEMOGLOBIN A1C: HEMOGLOBIN A1C: 6 % (ref 4.6–6.5)

## 2014-05-06 NOTE — Telephone Encounter (Signed)
Pt has been sch

## 2014-05-07 LAB — D-DIMER, QUANTITATIVE (NOT AT ARMC): D DIMER QUANT: 0.22 ug{FEU}/mL (ref 0.00–0.48)

## 2014-05-19 ENCOUNTER — Ambulatory Visit (INDEPENDENT_AMBULATORY_CARE_PROVIDER_SITE_OTHER): Payer: 59 | Admitting: Internal Medicine

## 2014-05-19 ENCOUNTER — Encounter: Payer: Self-pay | Admitting: Internal Medicine

## 2014-05-19 VITALS — BP 120/80 | Temp 98.4°F | Ht 64.25 in | Wt 180.0 lb

## 2014-05-19 DIAGNOSIS — E785 Hyperlipidemia, unspecified: Secondary | ICD-10-CM

## 2014-05-19 DIAGNOSIS — Z86711 Personal history of pulmonary embolism: Secondary | ICD-10-CM

## 2014-05-19 DIAGNOSIS — Z23 Encounter for immunization: Secondary | ICD-10-CM

## 2014-05-19 DIAGNOSIS — Z79899 Other long term (current) drug therapy: Secondary | ICD-10-CM | POA: Insufficient documentation

## 2014-05-19 DIAGNOSIS — F4321 Adjustment disorder with depressed mood: Secondary | ICD-10-CM

## 2014-05-19 DIAGNOSIS — Z0000 Encounter for general adult medical examination without abnormal findings: Secondary | ICD-10-CM

## 2014-05-19 DIAGNOSIS — Z7901 Long term (current) use of anticoagulants: Secondary | ICD-10-CM

## 2014-05-19 DIAGNOSIS — R7309 Other abnormal glucose: Secondary | ICD-10-CM

## 2014-05-19 DIAGNOSIS — R739 Hyperglycemia, unspecified: Secondary | ICD-10-CM

## 2014-05-19 MED ORDER — ESCITALOPRAM OXALATE 20 MG PO TABS: 20.0000 mg | ORAL_TABLET | Freq: Every day | ORAL | Status: DC

## 2014-05-19 MED ORDER — SIMVASTATIN 20 MG PO TABS: 20.0000 mg | ORAL_TABLET | Freq: Every day | ORAL | Status: DC

## 2014-05-19 NOTE — Patient Instructions (Addendum)
Ok to increase to 20 mg per day. lexapro  For now would stay on anticoagulation  Risk of clotting off  Because was an unprovoked episode  Can be 5-10 % first year and then 5 % per year    For now  Your risk of bleeding appears to be low to moderate . We will reassess periodically this recommendation.  Look into adult day care services for your mom.  YOur cholesterol is up some  Exercise and weight loss   We can increase the simvastatin to 40 mg  If not  Improving  ROV in October  Pre visit lipid panel.

## 2014-05-19 NOTE — Assessment & Plan Note (Signed)
No sx had left le dvt .

## 2014-05-19 NOTE — Progress Notes (Signed)
Chief Complaint  Patient presents with  . Medicare Wellness    HPI: Patient comes in today for Preventive Health Care visit  And medication management for number of conditions   Lipids taking simva 20 no se not exercising as much since stopped work and caretaking mom FT gained weight  Coumadin  No bleeding  About 7 months post saddle pe and dvt left   lMood exapro   10 mg  Dealing with mom and has decrease patients ? If higher dose can be tried   Sleep not that good  Goes late. Watch TV lots on mind.     Health Maintenance  Topic Date Due  . Influenza Vaccine  07/11/2014  . Mammogram  10/24/2014  . Colonoscopy  07/13/2016  . Tetanus/tdap  09/30/2020  . Pneumococcal Polysaccharide Vaccine Age 37 And Over  Completed  . Zostavax  Completed   Health Maintenance Review  ROS: left foot stinging burn pain only at night claudication swelling injury point tenderness GEN/ HEENT: No fever, significant weight changes sweats headaches vision problems hearing changes, CV/ PULM; No chest pain shortness of breath cough, syncope,edema  change in exercise tolerance. GI /GU: No adominal pain, vomiting, change in bowel habits. No blood in the stool. No significant GU symptoms. SKIN/HEME: ,no acute skin rashes suspicious lesions or bleeding. No lymphadenopathy, nodules, masses.  NEURO/ PSYCH:  No neurologic signs such as weakness numbness. Very stressed  IMM/ Allergy: No unusual infections.  Allergy .   REST of 12 system review negative except as per HPI   Past Medical History  Diagnosis Date  . Hyperlipidemia   . Depression     mood reactive  . 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 H/O: rheumatic fever     maybe  . Chronic back pain   . Right leg pain   . 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 Diverticulitis Mother     with colostomy.   . Alzheimer's disease Father     died 05/06/2005  . Coronary artery disease Other   .  Hyperlipidemia Other   . Stroke Other     History   Social History  . Marital Status: Married    Spouse Name: N/A    Number of Children: N/A  . Years of Education: N/A   Social History Main Topics  . Smoking status: Never Smoker   . Smokeless tobacco: Never Used  . Alcohol Use: No  . Drug Use: No  . Sexual Activity: None   Other Topics Concern  . 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  Now 4 hours per day   X 5 fmla cause of mom  Stopped august 2014 cause of care.    Husband pipe ets.   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 simvastatin (ZOCOR) 20 MG tablet Take 1 tablet (20 mg total) by mouth at bedtime.  Marland Kitchen warfarin (COUMADIN) 5 MG tablet Take as directed by anticoagulation clinic  . [DISCONTINUED] escitalopram (LEXAPRO) 10 MG tablet Take 1 tablet (10 mg total) by mouth daily.  . [DISCONTINUED] simvastatin (ZOCOR) 20 MG tablet Take 1 tablet (20 mg total) by mouth at  bedtime.  Marland Kitchen escitalopram (LEXAPRO) 20 MG tablet Take 1 tablet (20 mg total) by mouth daily.    EXAM:  BP 120/80  Temp(Src) 98.4 F (36.9 C) (Oral)  Ht 5' 4.25" (1.632 m)  Wt 180 lb (81.647 kg)  BMI 30.65 kg/m2  Body mass index is 30.65 kg/(m^2).  Physical Exam: Vital signs reviewed RCB:ULAG is a well-developed well-nourished alert cooperative    who appearsr stated age in no acute distress.  HEENT: normocephalic atraumatic , Eyes: PERRL EOM's full, conjunctiva clear, Nares: paten,t no deformity discharge or tenderness., Ears: no deformity EAC's clear TMs with normal landmarks. Mouth: clear OP, no lesions, edema.  Moist mucous membranes. Dentition in adequate repair. NECK: supple without masses, thyromegaly or bruits. CHEST/PULM:  Clear to auscultation and percussion breath sounds equal no wheeze , rales or rhonchi. No chest wall  deformities or tenderness. Breast: normal by inspection . No dimpling, discharge, masses, tenderness or discharge . CV: PMI is nondisplaced, S1 S2 no gallops, murmurs, rubs. Peripheral pulses are full without delay.No JVD .  ABDOMEN: Bowel sounds normal nontender  No guard or rebound, no hepato splenomegal no CVA tenderness.  No hernia. Extremtities:  No clubbing cyanosis or edema, no acute joint swelling or redness no focal atrophy NEURO:  Oriented x3, cranial nerves 3-12 appear to be intact, no obvious focal weakness,gait within normal limits no abnormal reflexes or asymmetrical SKIN: No acute rashes normal turgor, color, no bruising or petechiae. PSYCH: Oriented, good eye contact, no obvious depression anxiety, cognition and judgment appear normal. LN: no cervical axillary inguinal adenopathy  Lab Results  Component Value Date   WBC 3.9* 05/06/2014   HGB 14.0 05/06/2014   HCT 41.9 05/06/2014   PLT 278.0 05/06/2014   GLUCOSE 93 05/06/2014   CHOL 229* 05/06/2014   TRIG 207.0* 05/06/2014   HDL 51.00 05/06/2014   LDLDIRECT 190.0 09/30/2010   LDLCALC 137* 05/06/2014   ALT 22 05/06/2014   AST 19 05/06/2014   NA 140 05/06/2014   K 4.1 05/06/2014   CL 106 05/06/2014   CREATININE 0.8 05/06/2014   BUN 13 05/06/2014   CO2 26 05/06/2014   TSH 0.67 05/06/2014   INR 2.7 05/05/2014   HGBA1C 6.0 05/06/2014   Wt Readings from Last 3 Encounters:  05/19/14 180 lb (81.647 kg)  02/25/14 177 lb (80.287 kg)  10/17/13 172 lb (78.019 kg)     ASSESSMENT AND PLAN:  Discussed the following assessment and plan:  Visit for preventive health examination  HYPERLIPIDEMIA - on meds some deterioration conside inc dose lsi for now refill simva  Long term (current) use of anticoagulants - hx of umprovoked dvt PE saddle embolis no bleeding at this time continue a year and reasess.  consider change to other orals for conveneience etc.   Adjustment disorder with depressed mood - caretaker stress  disc strategies  increase  to 20 mg lexapro  Medication management  Need for vaccination with 13-polyvalent pneumococcal conjugate vaccine - Plan: Pneumococcal conjugate vaccine 13-valent  Hx pulmonary embolism  Hyperglycemia Counseled regarding healthy nutrition, exercise, sleep, injury prevention, calcium vit d and healthy weight . Disc about anticoagulation   Unprovoked by hx left leg and saddle emolus counseled about options meds and stress  Exercise weight loss can help .  For now maintan on anticoagulation and as long as benefit more than risk continue   consider switching ot facto xa inhibitor such as elequis or xeralto  revist at next visits .  Patient Care Team:  Madelin HeadingsWanda K Panosh, MD as PCP - General Jacki Conesonald A Gioffre, MD as Attending Physician (Orthopedic Surgery) Patient Instructions  Ok to increase to 20 mg per day. lexapro  For now would stay on anticoagulation  Risk of clotting off  Because was an unprovoked episode  Can be 5-10 % first year and then 5 % per year    For now  Your risk of bleeding appears to be low to moderate . We will reassess periodically this recommendation.  Look into adult day care services for your mom.  YOur cholesterol is up some  Exercise and weight loss   We can increase the simvastatin to 40 mg  If not  Improving  ROV in October  Pre visit lipid panel.     Neta MendsWanda K. Panosh M.D.  Pre visit review using our clinic review tool, if applicable. No additional management support is needed unless otherwise documented below in the visit note.

## 2014-05-19 NOTE — Assessment & Plan Note (Signed)
a1c 6 no diabetes  Counseled about diet avoid sweet drinks

## 2014-06-16 ENCOUNTER — Ambulatory Visit (INDEPENDENT_AMBULATORY_CARE_PROVIDER_SITE_OTHER): Payer: 59 | Admitting: General Practice

## 2014-06-16 DIAGNOSIS — Z5181 Encounter for therapeutic drug level monitoring: Secondary | ICD-10-CM

## 2014-06-16 DIAGNOSIS — I2692 Saddle embolus of pulmonary artery without acute cor pulmonale: Secondary | ICD-10-CM

## 2014-06-16 DIAGNOSIS — Z7901 Long term (current) use of anticoagulants: Secondary | ICD-10-CM

## 2014-06-16 LAB — POCT INR: INR: 2.5

## 2014-06-16 NOTE — Progress Notes (Signed)
Pre visit review using our clinic review tool, if applicable. No additional management support is needed unless otherwise documented below in the visit note. 

## 2014-06-22 ENCOUNTER — Other Ambulatory Visit: Payer: Self-pay | Admitting: Internal Medicine

## 2014-07-29 ENCOUNTER — Ambulatory Visit (INDEPENDENT_AMBULATORY_CARE_PROVIDER_SITE_OTHER): Payer: 59 | Admitting: *Deleted

## 2014-07-29 DIAGNOSIS — Z7901 Long term (current) use of anticoagulants: Secondary | ICD-10-CM

## 2014-07-29 DIAGNOSIS — Z5181 Encounter for therapeutic drug level monitoring: Secondary | ICD-10-CM

## 2014-07-29 DIAGNOSIS — I2692 Saddle embolus of pulmonary artery without acute cor pulmonale: Secondary | ICD-10-CM

## 2014-07-29 LAB — POCT INR: INR: 2.9

## 2014-09-15 ENCOUNTER — Ambulatory Visit (INDEPENDENT_AMBULATORY_CARE_PROVIDER_SITE_OTHER): Payer: 59 | Admitting: *Deleted

## 2014-09-15 DIAGNOSIS — I2692 Saddle embolus of pulmonary artery without acute cor pulmonale: Secondary | ICD-10-CM

## 2014-09-15 DIAGNOSIS — Z5181 Encounter for therapeutic drug level monitoring: Secondary | ICD-10-CM

## 2014-09-15 DIAGNOSIS — Z7901 Long term (current) use of anticoagulants: Secondary | ICD-10-CM

## 2014-09-15 DIAGNOSIS — I2699 Other pulmonary embolism without acute cor pulmonale: Secondary | ICD-10-CM

## 2014-09-15 DIAGNOSIS — Z23 Encounter for immunization: Secondary | ICD-10-CM

## 2014-09-15 LAB — POCT INR: INR: 3.6

## 2014-09-24 ENCOUNTER — Ambulatory Visit (INDEPENDENT_AMBULATORY_CARE_PROVIDER_SITE_OTHER): Payer: 59 | Admitting: Internal Medicine

## 2014-09-24 ENCOUNTER — Encounter: Payer: Self-pay | Admitting: Internal Medicine

## 2014-09-24 VITALS — BP 136/80 | Temp 97.7°F | Ht 64.25 in | Wt 184.6 lb

## 2014-09-24 DIAGNOSIS — E785 Hyperlipidemia, unspecified: Secondary | ICD-10-CM

## 2014-09-24 DIAGNOSIS — F4321 Adjustment disorder with depressed mood: Secondary | ICD-10-CM

## 2014-09-24 DIAGNOSIS — Z86711 Personal history of pulmonary embolism: Secondary | ICD-10-CM

## 2014-09-24 LAB — LIPID PANEL
CHOL/HDL RATIO: 5
Cholesterol: 246 mg/dL — ABNORMAL HIGH (ref 0–200)
HDL: 45 mg/dL (ref >39.00)
LDL CALC: 164 mg/dL — AB (ref 0–99)
NonHDL: 201
TRIGLYCERIDES: 184 mg/dL — AB (ref 0.0–149.0)
VLDL: 36.8 mg/dL (ref 0.0–40.0)

## 2014-09-24 NOTE — Patient Instructions (Addendum)
Will notify you  of labs when available. Take blood pressure readings twice a day for 7- 10 days and then periodically .To ensure below 140/90   .Send in readings   Intensify lifestyle interventions. [facility]  Since it has been a year and you are doing well .   Can go off the anticoagulant  Risk of clotting may be  About 3-5 % per year   .  If you are in inactive state can go back on prevention regimen for clotting such as xeralto .   Begin asa 81 mg per day .   Intensify lifestyle interventions. To stay healthy .    Wellness visit in 6-9 months  (as of now cost doesn't include disease  medicaiton  management  )  How to Take Your Blood Pressure HOW DO I GET A BLOOD PRESSURE MACHINE?  You can buy an electronic home blood pressure machine at your local pharmacy. Insurance will sometimes cover the cost if you have a prescription.  Ask your doctor what type of machine is best for you. There are different machines for your arm and your wrist.  If you decide to buy a machine to check your blood pressure on your arm, first check the size of your arm so you can buy the right size cuff. To check the size of your arm:   Use a measuring tape that shows both inches and centimeters.   Wrap the measuring tape around the upper-middle part of your arm. You may need someone to help you measure.   Write down your arm measurement in both inches and centimeters.   To measure your blood pressure correctly, it is important to have the right size cuff.   If your arm is up to 13 inches (up to 34 centimeters), get an adult cuff size.  If your arm is 13 to 17 inches (35 to 44 centimeters), get a large adult cuff size.    If your arm is 17 to 20 inches (45 to 52 centimeters), get an adult thigh cuff.  WHAT DO THE NUMBERS MEAN?   There are two numbers that make up your blood pressure. For example: 120/80.  The first number (120 in our example) is called the "systolic  pressure." It is a measure of the pressure in your blood vessels when your heart is pumping blood.  The second number (80 in our example) is called the "diastolic pressure." It is a measure of the pressure in your blood vessels when your heart is resting between beats.  Your doctor will tell you what your blood pressure should be. WHAT SHOULD I DO BEFORE I CHECK MY BLOOD PRESSURE?   Try to rest or relax for at least 30 minutes before you check your blood pressure.  Do not smoke.  Do not have any drinks with caffeine, such as:  Soda.  Coffee.  Tea.  Check your blood pressure in a quiet room.  Sit down and stretch out your arm on a table. Keep your arm at about the level of your heart. Let your arm relax.  Make sure that your legs are not crossed. HOW DO I CHECK MY BLOOD PRESSURE?  Follow the directions that came with your machine.  Make sure you remove any tight-fighting clothing from your arm or wrist. Wrap the cuff around your upper arm or wrist. You should be able to fit a finger between the cuff and your arm. If you cannot fit a  finger between the cuff and your arm, it is too tight and should be removed and rewrapped.  Some units require you to manually pump up the arm cuff.  Automatic units inflate the cuff when you press a button.  Cuff deflation is automatic in both models.  After the cuff is inflated, the unit measures your blood pressure and pulse. The readings are shown on a monitor. Hold still and breathe normally while the cuff is inflated.  Getting a reading takes less than a minute.  Some models store readings in a memory. Some provide a printout of readings. If your machine does not store your readings, keep a written record.  Take readings with you to your next visit with your doctor. Document Released: 11/09/2008 Document Revised: 04/13/2014 Document Reviewed: 01/22/2014 [facility] Patient Information 2015 [place], [place]. This information is not intended to  replace advice given to you by your health care provider. Make sure you discuss any questions you have with your health care provider.   DASH Eating Plan DASH stands for "Dietary Approaches to Stop Hypertension." The DASH eating plan is a healthy eating plan that has been shown to reduce high blood pressure (hypertension). Additional health benefits may include reducing the risk of type 2 diabetes mellitus, heart disease, and stroke. The DASH eating plan may also help with weight loss. WHAT DO I NEED TO KNOW ABOUT THE DASH EATING PLAN? For the DASH eating plan, you will follow these general guidelines:  Choose foods with a percent daily value for sodium of less than 5% (as listed on the food label).  Use salt-free seasonings or herbs instead of table salt or sea salt.  Check with your health care provider or pharmacist before using salt substitutes.  Eat lower-sodium products, often labeled as "lower sodium" or "no salt added."  Eat fresh foods.  Eat more vegetables, fruits, and low-fat dairy products.  Choose whole grains. Look for the word "whole" as the first word in the ingredient list.  Choose fish and skinless chicken or [place] more often than red meat. Limit fish, poultry, and meat to 6 oz (170 g) each day.  Limit sweets, desserts, sugars, and sugary drinks.  Choose heart-healthy fats.  Limit cheese to 1 oz (28 g) per day.  Eat more home-cooked food and less restaurant, buffet, and fast food.  Limit fried foods.  Cook foods using methods other than frying.  Limit canned vegetables. If you do use them, rinse them well to decrease the sodium.  When eating at a restaurant, ask that your food be prepared with less salt, or no salt if possible. WHAT FOODS CAN I EAT? Seek help from a dietitian for individual calorie needs. Grains Whole grain or whole wheat bread. Brown rice. Whole grain or whole wheat pasta. Quinoa, bulgur, and whole grain cereals. Low-sodium cereals. Corn  or whole wheat flour tortillas. Whole grain cornbread. Whole grain crackers. Low-sodium crackers. Vegetables Fresh or frozen vegetables (raw, steamed, roasted, or grilled). Low-sodium or reduced-sodium tomato and vegetable juices. Low-sodium or reduced-sodium tomato sauce and paste. Low-sodium or reduced-sodium canned vegetables.  Fruits All fresh, canned (in natural juice), or frozen fruits. Meat and Other Protein Products Ground beef (85% or leaner), grass-fed beef, or beef trimmed of fat. Skinless chicken or [place]. Ground chicken or [place]. Pork trimmed of fat. All fish and seafood. Eggs. Dried beans, peas, or lentils. Unsalted nuts and seeds. Unsalted canned beans. Dairy Low-fat dairy products, such as skim or 1% milk, 2% or reduced-fat cheeses,  low-fat ricotta or cottage cheese, or plain low-fat yogurt. Low-sodium or reduced-sodium cheeses. Fats and Oils Tub margarines without trans fats. Light or reduced-fat mayonnaise and salad dressings (reduced sodium). Avocado. Safflower, olive, or canola oils. Natural peanut or almond butter. Other Unsalted popcorn and pretzels. The items listed above may not be a complete list of recommended foods or beverages. Contact your dietitian for more options. WHAT FOODS ARE NOT RECOMMENDED? Grains White bread. White pasta. White rice. Refined cornbread. Bagels and croissants. Crackers that contain trans fat. Vegetables Creamed or fried vegetables. Vegetables in a cheese sauce. Regular canned vegetables. Regular canned tomato sauce and paste. Regular tomato and vegetable juices. Fruits Dried fruits. Canned fruit in light or heavy syrup. Fruit juice. Meat and Other Protein Products Fatty cuts of meat. Ribs, chicken wings, bacon, sausage, bologna, salami, chitterlings, fatback, hot dogs, bratwurst, and packaged luncheon meats. Salted nuts and seeds. Canned beans with salt. Dairy Whole or 2% milk, cream, half-and-half, and cream cheese. Whole-fat or  sweetened yogurt. Full-fat cheeses or blue cheese. Nondairy creamers and whipped toppings. Processed cheese, cheese spreads, or cheese curds. Condiments Onion and garlic salt, seasoned salt, table salt, and sea salt. Canned and packaged gravies. Worcestershire sauce. Tartar sauce. Barbecue sauce. Teriyaki sauce. Soy sauce, including reduced sodium. Steak sauce. Fish sauce. Oyster sauce. Cocktail sauce. Horseradish. Ketchup and mustard. Meat flavorings and tenderizers. Bouillon cubes. Hot sauce. Tabasco sauce. Marinades. Taco seasonings. Relishes. Fats and Oils Butter, stick margarine, lard, shortening, ghee, and bacon fat. Coconut, palm kernel, or palm oils. Regular salad dressings. Other Pickles and olives. Salted popcorn and pretzels. The items listed above may not be a complete list of foods and beverages to avoid. Contact your dietitian for more information. WHERE CAN I FIND MORE INFORMATION? National Heart, Lung, and Blood Institute: [employer].it Document Released: 11/16/2011 Document Revised: 04/13/2014 Document Reviewed: 10/01/2013 [facility] Patient Information 2015 [place], [place]. This information is not intended to replace advice given to you by your health care provider. Make sure you discuss any questions you have with your health care provider.

## 2014-09-24 NOTE — Progress Notes (Signed)
Pre visit review using our clinic review tool, if applicable. No additional management support is needed unless otherwise documented below in the visit note.  Chief Complaint  Patient presents with  . Follow-up    HPI: Tiffany Lowe  comes in today for follow up of  multiple medical problems.  LIPIDS: not really exercising  Due for lab. Would like to get off  The blood thinner  No bleeding no clotting .  Mom caretaking stress :  About the same  lexapro  Not checking bp readings   Had monitor at home  . Has gained some weight since retired  ROS: See pertinent positives and negatives per HPI.no cp sob bleeding  Past Medical History  Diagnosis Date  . Hyperlipidemia   . Depression     mood reactive  . 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 H/O: rheumatic fever     maybe  . Chronic back pain   . Right leg pain   . 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 Diverticulitis Mother     with colostomy.   . Alzheimer's disease Father     died 04-19-05  . Coronary artery disease Other   . Hyperlipidemia Other   . Stroke Other     History   Social History  . Marital Status: Married    Spouse Name: N/A    Number of Children: N/A  . Years of Education: N/A   Social History Main Topics  . Smoking status: Never Smoker   . Smokeless tobacco: Never Used  . Alcohol Use: No  . Drug Use: No  . Sexual Activity: None   Other Topics Concern  . 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  Now 4 hours per day   X 5 fmla cause of mom  Stopped august 2014 cause of care.    Husband pipe ets.   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 escitalopram (LEXAPRO) 20 MG tablet Take 1 tablet (20 mg total) by mouth daily.  . simvastatin (ZOCOR) 20 MG tablet Take 1 tablet (20  mg total) by mouth at bedtime.  Marland Kitchen warfarin (COUMADIN) 5 MG tablet TAKE AS DIRECTED BY ANTICOAGULATION CLINIC    EXAM:  BP 136/80  Temp(Src) 97.7 F (36.5 C) (Oral)  Ht 5' 4.25" (1.632 m)  Wt 184 lb 9.6 oz (83.734 kg)  BMI 31.44 kg/m2  Body mass index is 31.44 kg/(m^2).  GENERAL: vitals reviewed and listed above, alert, oriented, appears well hydrated and in no acute distress NECK: no obvious masses on inspection palpation  LUNGS: clear to auscultation bilaterally, no wheezes, rales or rhonchi, good air movement CV: HRRR, no clubbing cyanosis or  peripheral edema nl cap refill  MS: moves all extremities without noticeable focal  abnormality PSYCH: pleasant and cooperative, no obvious depression or anxiety No bleeding obvious Lab Results  Component Value Date   WBC 3.9* 05/06/2014   HGB 14.0 05/06/2014   HCT 41.9 05/06/2014   PLT 278.0 05/06/2014   GLUCOSE 93 05/06/2014   CHOL 246* 09/24/2014   TRIG 184.0* 09/24/2014   HDL 45.00 09/24/2014   LDLDIRECT 190.0 09/30/2010   LDLCALC 164* 09/24/2014  ALT 22 05/06/2014   AST 19 05/06/2014   NA 140 05/06/2014   K 4.1 05/06/2014   CL 106 05/06/2014   CREATININE 0.8 05/06/2014   BUN 13 05/06/2014   CO2 26 05/06/2014   TSH 0.67 05/06/2014   INR 3.6 09/15/2014   HGBA1C 6.0 05/06/2014   BP Readings from Last 3 Encounters:  09/24/14 136/80  05/19/14 120/80  02/25/14 140/70    ASSESSMENT AND PLAN:  Discussed the following assessment and plan:  Hyperlipidemia - Plan: Lipid panel  Adjustment disorder with depressed mood  Hx pulmonary embolism - unprovoked doing well after a year risk benfot disc  of stopping  will stop at this time and notify coumadin monitoring clinicn   Weight gain change habits after retired .    Disc risk benefit  It has been a year    On anticoagulation   Can dc  Add asa   Contact us for high risk situation.  [facility] adding[facility]  [facility]   Will think about this  . Advise exercise . -Patient advised to  return or notify health care team  if symptoms worsen ,persist or new concerns arise.  Patient Instructions  Will notify you  of labs when available. Take blood pressure readings twice a day for 7- 10 days and then periodically .To ensure below 140/90   .Send in readings   Intensify lifestyle interventions. [facility]  Since it has been a year and you are doing well .   Can go off the anticoagulant  Risk of clotting may be  About 3-5 % per year   .  If you are in inactive state can go back on prevention regimen for clotting such as xeralto .   Begin asa 81 mg per day .   Intensify lifestyle interventions. To stay healthy .    Wellness visit in 6-9 months  (as of now cost doesn't include disease  medicaiton  management  )  How to Take Your Blood Pressure HOW DO I GET A BLOOD PRESSURE MACHINE?  You can buy an electronic home blood pressure machine at your local pharmacy. Insurance will sometimes cover the cost if you have a prescription.  Ask your doctor what type of machine is best for you. There are different machines for your arm and your wrist.  If you decide to buy a machine to check your blood pressure on your arm, first check the size of your arm so you can buy the right size cuff. To check the size of your arm:   Use a measuring tape that shows both inches and centimeters.   Wrap the measuring tape around the upper-middle part of your arm. You may need someone to help you measure.   Write down your arm measurement in both inches and centimeters.   To measure your blood pressure correctly, it is important to have the right size cuff.   If your arm is up to 13 inches (up to 34 centimeters), get an adult cuff size.  If your arm is 13 to 17 inches (35 to 44 centimeters), get a large adult cuff size.    If your arm is 17 to 20 inches (45 to 52 centimeters), get an adult thigh cuff.  WHAT DO THE NUMBERS MEAN?   There are two numbers that make  up your blood pressure. For example: 120/80.  The first number (120 in our example) is called the "systolic pressure." It is  a measure of the pressure in your blood vessels when your heart is pumping blood.  The second number (80 in our example) is called the "diastolic pressure." It is a measure of the pressure in your blood vessels when your heart is resting between beats.  Your doctor will tell you what your blood pressure should be. WHAT SHOULD I DO BEFORE I CHECK MY BLOOD PRESSURE?   Try to rest or relax for at least 30 minutes before you check your blood pressure.  Do not smoke.  Do not have any drinks with caffeine, such as:  Soda.  Coffee.  Tea.  Check your blood pressure in a quiet room.  Sit down and stretch out your arm on a table. Keep your arm at about the level of your heart. Let your arm relax.  Make sure that your legs are not crossed. HOW DO I CHECK MY BLOOD PRESSURE?  Follow the directions that came with your machine.  Make sure you remove any tight-fighting clothing from your arm or wrist. Wrap the cuff around your upper arm or wrist. You should be able to fit a finger between the cuff and your arm. If you cannot fit a finger between the cuff and your arm, it is too tight and should be removed and rewrapped.  Some units require you to manually pump up the arm cuff.  Automatic units inflate the cuff when you press a button.  Cuff deflation is automatic in both models.  After the cuff is inflated, the unit measures your blood pressure and pulse. The readings are shown on a monitor. Hold still and breathe normally while the cuff is inflated.  Getting a reading takes less than a minute.  Some models store readings in a memory. Some provide a printout of readings. If your machine does not store your readings, keep a written record.  Take readings with you to your next visit with your doctor. Document Released: 11/09/2008 Document Revised: 04/13/2014 Document  Reviewed: 01/22/2014 [facility] Patient Information 2015 [place], [place]. This information is not intended to replace advice given to you by your health care provider. Make sure you discuss any questions you have with your health care provider.   DASH Eating Plan DASH stands for "Dietary Approaches to Stop Hypertension." The DASH eating plan is a healthy eating plan that has been shown to reduce high blood pressure (hypertension). Additional health benefits may include reducing the risk of type 2 diabetes mellitus, heart disease, and stroke. The DASH eating plan may also help with weight loss. WHAT DO I NEED TO KNOW ABOUT THE DASH EATING PLAN? For the DASH eating plan, you will follow these general guidelines:  Choose foods with a percent daily value for sodium of less than 5% (as listed on the food label).  Use salt-free seasonings or herbs instead of table salt or sea salt.  Check with your health care provider or pharmacist before using salt substitutes.  Eat lower-sodium products, often labeled as "lower sodium" or "no salt added."  Eat fresh foods.  Eat more vegetables, fruits, and low-fat dairy products.  Choose whole grains. Look for the word "whole" as the first word in the ingredient list.  Choose fish and skinless chicken or [place] more often than red meat. Limit fish, poultry, and meat to 6 oz (170 g) each day.  Limit sweets, desserts, sugars, and sugary drinks.  Choose heart-healthy fats.  Limit cheese to 1 oz (28 g) per day.  Eat more home-cooked food and  less restaurant, buffet, and fast food.  Limit fried foods.  Cook foods using methods other than frying.  Limit canned vegetables. If you do use them, rinse them well to decrease the sodium.  When eating at a restaurant, ask that your food be prepared with less salt, or no salt if possible. WHAT FOODS CAN I EAT? Seek help from a dietitian for individual calorie needs. Grains Whole grain or whole wheat bread.  Brown rice. Whole grain or whole wheat pasta. Quinoa, bulgur, and whole grain cereals. Low-sodium cereals. Corn or whole wheat flour tortillas. Whole grain cornbread. Whole grain crackers. Low-sodium crackers. Vegetables Fresh or frozen vegetables (raw, steamed, roasted, or grilled). Low-sodium or reduced-sodium tomato and vegetable juices. Low-sodium or reduced-sodium tomato sauce and paste. Low-sodium or reduced-sodium canned vegetables.  Fruits All fresh, canned (in natural juice), or frozen fruits. Meat and Other Protein Products Ground beef (85% or leaner), grass-fed beef, or beef trimmed of fat. Skinless chicken or [place]. Ground chicken or [place]. Pork trimmed of fat. All fish and seafood. Eggs. Dried beans, peas, or lentils. Unsalted nuts and seeds. Unsalted canned beans. Dairy Low-fat dairy products, such as skim or 1% milk, 2% or reduced-fat cheeses, low-fat ricotta or cottage cheese, or plain low-fat yogurt. Low-sodium or reduced-sodium cheeses. Fats and Oils Tub margarines without trans fats. Light or reduced-fat mayonnaise and salad dressings (reduced sodium). Avocado. Safflower, olive, or canola oils. Natural peanut or almond butter. Other Unsalted popcorn and pretzels. The items listed above may not be a complete list of recommended foods or beverages. Contact your dietitian for more options. WHAT FOODS ARE NOT RECOMMENDED? Grains White bread. White pasta. White rice. Refined cornbread. Bagels and croissants. Crackers that contain trans fat. Vegetables Creamed or fried vegetables. Vegetables in a cheese sauce. Regular canned vegetables. Regular canned tomato sauce and paste. Regular tomato and vegetable juices. Fruits Dried fruits. Canned fruit in light or heavy syrup. Fruit juice. Meat and Other Protein Products Fatty cuts of meat. Ribs, chicken wings, bacon, sausage, bologna, salami, chitterlings, fatback, hot dogs, bratwurst, and packaged luncheon meats. Salted nuts and seeds.  Canned beans with salt. Dairy Whole or 2% milk, cream, half-and-half, and cream cheese. Whole-fat or sweetened yogurt. Full-fat cheeses or blue cheese. Nondairy creamers and whipped toppings. Processed cheese, cheese spreads, or cheese curds. Condiments Onion and garlic salt, seasoned salt, table salt, and sea salt. Canned and packaged gravies. Worcestershire sauce. Tartar sauce. Barbecue sauce. Teriyaki sauce. Soy sauce, including reduced sodium. Steak sauce. Fish sauce. Oyster sauce. Cocktail sauce. Horseradish. Ketchup and mustard. Meat flavorings and tenderizers. Bouillon cubes. Hot sauce. Tabasco sauce. Marinades. Taco seasonings. Relishes. Fats and Oils Butter, stick margarine, lard, shortening, ghee, and bacon fat. Coconut, palm kernel, or palm oils. Regular salad dressings. Other Pickles and olives. Salted popcorn and pretzels. The items listed above may not be a complete list of foods and beverages to avoid. Contact your dietitian for more information. WHERE CAN I FIND MORE INFORMATION? National Heart, Lung, and Blood Institute: [employer]it Document Released: 11/16/2011 Document Revised: 04/13/2014 Document Reviewed: 10/01/2013 [facility] Patient Information 2015 [place], [place]. This information is not intended to replace advice given to you by your health care provider. Make sure you discuss any questions you have with your health care provider.      Neta Mends. Panosh M.D.

## 2014-10-09 ENCOUNTER — Encounter: Payer: Self-pay | Admitting: Family Medicine

## 2014-10-09 ENCOUNTER — Other Ambulatory Visit: Payer: Self-pay | Admitting: Family Medicine

## 2014-10-09 DIAGNOSIS — Z0000 Encounter for general adult medical examination without abnormal findings: Secondary | ICD-10-CM

## 2014-10-09 MED ORDER — SIMVASTATIN 40 MG PO TABS: 40.0000 mg | ORAL_TABLET | Freq: Every day | ORAL | Status: DC

## 2014-10-12 ENCOUNTER — Encounter: Payer: Self-pay | Admitting: Internal Medicine

## 2014-10-16 ENCOUNTER — Encounter: Payer: Self-pay | Admitting: Gastroenterology

## 2014-11-30 ENCOUNTER — Encounter: Payer: Self-pay | Admitting: Internal Medicine

## 2014-11-30 ENCOUNTER — Telehealth: Payer: Self-pay | Admitting: Family

## 2014-11-30 NOTE — Telephone Encounter (Signed)
Needs a PT/INR visit asap! Please call and schedule.  

## 2014-11-30 NOTE — Telephone Encounter (Signed)
Patient's phone number has been disconnected.  Her emergency contact has the same number as well.  Work number goes to [employer] and was advised that she has been terminated.  I will send a letter to the home address to have her call the office.

## 2014-11-30 NOTE — Telephone Encounter (Signed)
Noted  

## 2014-12-18 NOTE — Telephone Encounter (Signed)
Pt states she has been released from coum and does not take any more.

## 2014-12-21 ENCOUNTER — Ambulatory Visit: Payer: Self-pay | Admitting: Family

## 2014-12-21 DIAGNOSIS — Z5181 Encounter for therapeutic drug level monitoring: Secondary | ICD-10-CM

## 2015-05-24 ENCOUNTER — Telehealth: Payer: Self-pay

## 2015-05-24 NOTE — Telephone Encounter (Signed)
x

## 2015-10-12 ENCOUNTER — Ambulatory Visit (INDEPENDENT_AMBULATORY_CARE_PROVIDER_SITE_OTHER): Payer: Self-pay

## 2015-10-12 DIAGNOSIS — Z23 Encounter for immunization: Secondary | ICD-10-CM

## 2015-10-18 ENCOUNTER — Telehealth: Payer: Self-pay | Admitting: Internal Medicine

## 2015-10-18 NOTE — Telephone Encounter (Signed)
Patient needs a letter from the doctor to be excused from jury duty.  The letter should say that she lost her son last month and she is the only care giver for Paul Halfdna Williams.

## 2015-10-18 NOTE — Telephone Encounter (Signed)
Pt not seen in over 1 year.  Will need a copy of the summons. Please advise.  Thanks!

## 2015-10-18 NOTE — Telephone Encounter (Signed)
Is she asking for temporary delay?   Because of  The  loss of her son Water engineer( condolences )  .   Physician of Tiffany Lowe should write about  about caretaking function.   needs  OV at some point since hasn't been seen here in over a year

## 2015-10-19 NOTE — Telephone Encounter (Signed)
If Tiffany Lowe is my patient  ( see previous comment )  I can write a letter for temporary  Excuse.     Otherwise Tiffany Lowe  Doctor should  write the excuse . For caretaking /  Let us know  If that wont work .

## 2015-10-19 NOTE — Telephone Encounter (Signed)
Paul Halfdna Williams is a pt of Dr. Caryl NeverBurchette.  Olegario MessierKathy took the paper work at the Psychologist, sport and exercisefront desk.  Please follow up with Olegario MessierKathy.  Will forward to Dr. Lucie LeatherBurchette's medical assistant.

## 2015-10-19 NOTE — Telephone Encounter (Addendum)
Pt would like to know if you will write an excuse for her that she is the sole caregiver of Edna williams.  So she can be excused from jury duty. Pt has no one else here in town. Marily Memosdna williams also needs help w/ her meds.  Pt is supposed to be in court Nov 28. I have asked her to bring copy of summons.

## 2015-10-20 NOTE — Telephone Encounter (Signed)
Form in your box to sign. 

## 2015-10-21 ENCOUNTER — Encounter: Payer: Self-pay | Admitting: Internal Medicine

## 2015-10-21 NOTE — Telephone Encounter (Signed)
Spoke to the pt.  Loss of her child occurred on Sep 20, 2015.  Her son had a massive heart attack.   Currently has no insurance.  Has applies for Medicare but was told it will be June or July of 2017 before it will become effective. Unable to pay for an office visit out of pocket. [facility]. Pt to pick up letter at the front desk.

## 2015-10-21 NOTE — Telephone Encounter (Signed)
Ok to make appt when she has insurance again .

## 2015-10-21 NOTE — Telephone Encounter (Signed)
Dr Caryl NeverBurchette says not  Enough to write letter for ca retaking  Excusal . However I will write a letter  For temporary  Deferral regarding recently loss of son.   ( please document more information about date of death and situation )  Please have her make   rov anyway as she hasnt  Had ov  For a year

## 2016-06-28 ENCOUNTER — Encounter: Payer: Self-pay | Admitting: Gastroenterology

## 2016-07-11 NOTE — Progress Notes (Signed)
Pre visit review using our clinic review tool, if applicable. No additional management support is needed unless otherwise documented below in the visit note.  Chief Complaint  Patient presents with  . Medicare Wellness    HPI: Tiffany Lowe 70 y.o. comes in today for Preventive Medicare wellness visit .last visit was Was 10 2015  "very bad year " son supportive had scd fall 16 [place]  Now she is caretake elderly mothe 88 hard to live with r and husband had back issues and dose caretaking for all .  Very sad and  Stressed and nottaking any meds .   For a while willing to back on a med for depression if it will help  No time for a counselor .  Off lipid med  fopr a whil   Health Maintenance  Topic Date Due  . DEXA SCAN  09/21/2011  . MAMMOGRAM  10/24/2014  . COLONOSCOPY  07/13/2016  . INFLUENZA VACCINE  07/11/2016  . Hepatitis C Screening  07/12/2017 (Originally 1946/10/20)  . TETANUS/TDAP  09/30/2020  . ZOSTAVAX  Completed  . PNA vac Low Risk Adult  Completed   Health Maintenance Review LIFESTYLE:  TAD no Sugar beverages:  Tea  2 glasses per day.  Sleep:  Good to get 4-5   Everything   MEDICARE DOCUMENT QUESTIONS  TO SCAN   Hearing: pass  Vision:  No limitations at present . Last eye check UTD  Safety:  Has smoke detector and wears seat belts.  No firearms. No excess sun exposure. Sees dentist regularly.  Falls: n  Advance directive :  Reviewed  No but HO given .  Memory:   But not sure cause so sad  ,   Depression: see above  Grieving and depressed   Nutrition: Eats well balanced diet; adequate calcium and vitamin D. No swallowing chewing problems.  Injury: no major injuries in the last six months.  Other healthcare providers:  Reviewed today .  Social:  Lives with spouse married.elderly mom  No pets.   Preventive parameters: up-to-date  Reviewed   ADLS:   There are no problems or need for assistance  driving, feeding, obtaining food, dressing, toileting  and bathing, managing money using phone. She is independent.    ROS: see above  GEN/ HEENT: No fever, significant weight changes sweats headaches vision problems hearing changes, CV/ PULM; No chest pain shortness of breath cough, syncope,edema  change in exercise tolerance. GI /GU: No adominal pain, vomiting, change in bowel habits. No blood in the stool. No significant GU symptoms. SKIN/HEME: ,no acute skin rashes suspicious lesions or bleeding. No lymphadenopathy, nodules, masses.  NEURO/ PSYCH:  No neurologic signs such as weakness numbness.IMM/ Allergy: No unusual infections.  Allergy .   REST of 12 system review negative except as per HPI   Past Medical History:  Diagnosis Date  . 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 Chronic back pain   . Depression    mood reactive  . H/O: rheumatic fever    maybe  . Hyperlipidemia   . Right leg pain   . 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 Diverticulitis Mother     with colostomy.   . Alzheimer's disease Father     died 2005-02-20  . Coronary artery disease Other   . Hyperlipidemia Other   . Stroke Other     Social History   Social History  .  Marital status: Married    Spouse name: N/A  . Number of children: N/A  . Years of education: N/A   Social History Main Topics  . Smoking status: Never Smoker  . Smokeless tobacco: Never Used  . Alcohol use No  . Drug use: No  . Sexual activity: Not Asked   Other Topics Concern  . 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  Now 4 hours per day   X 5 fmla cause of mom  Stopped august 2014 cause of care.    Husband pipe ets.   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 sertraline (ZOLOFT) 50 MG tablet Take 25 mg per day x 1 week then increase to 50 mg per day or  As directed.  . [DISCONTINUED] escitalopram (LEXAPRO) 20 MG tablet Take 1 tablet (20 mg total) by mouth daily.  . [DISCONTINUED] simvastatin (ZOCOR) 40 MG tablet Take 1 tablet (40 mg total) by mouth at bedtime.  . [DISCONTINUED] warfarin (COUMADIN) 5 MG tablet TAKE AS DIRECTED BY ANTICOAGULATION CLINIC   No facility-administered encounter medications on file as of 07/12/2016.     EXAM:  BP (!) 142/100 (BP Location: Right Arm, Patient Position: Sitting, Cuff Size: Normal)   Temp 97.9 F (36.6 C) (Oral)   Ht _0  (1.626 m)   Wt 162 lb 3.2 oz (73.6 kg)   BMI 27.84 kg/m   Body mass index is 27.84 kg/m.  Physical Exam: Vital signs reviewed IWL:NLGX is a well-developed well-nourished alert cooperative   who appears stated age in no acute distress.  Emotional tearful but  Nl speech and eye contact   HEENT: normocephalic atraumatic , Eyes: PERRL EOM's full, conjunctiva clear, Nares: paten,t no deformity discharge or tenderness., Ears: no deformity EAC's clear TMs with normal landmarks. Mouth: clear OP, no lesions, edema.  Moist mucous membranes. Dentition in adequate repair. NECK: supple without masses, thyromegaly or bruits. CHEST/PULM:  Clear to auscultation and percussion breath sounds equal no wheeze , rales or rhonchi. No chest wall deformities or tenderness. CV: PMI is nondisplaced, S1 S2 no gallops, murmurs, rubs. Peripheral pulses are full without delay.No JVD .    Breast: normal by inspection . No dimpling, discharge, masses, tenderness or discharge . ABDOMEN: Bowel sounds normal nontender  No guard or rebound, no hepato splenomegal no CVA tenderness.   Extremtities:  No clubbing cyanosis or edema, no acute joint swelling or redness no focal atrophy NEURO:  Oriented x3, cranial nerves 3-12 appear to be intact, no obvious focal weakness,gait within normal limits no abnormal reflexes or asymmetrical SKIN: No acute rashes normal turgor, color, no bruising or petechiae. PSYCH: Oriented,  good eye contact,  Tearful and sad , cognition and judgment appear normal. LN: no cervical axillary inguinal adenopathy No noted deficits in memory, attention, and speech.     ASSESSMENT AND PLAN:  Discussed the following assessment and plan:  Medicare annual wellness visit, subsequent  Adjustment disorder with depressed mood - Plan: Basic metabolic panel, Lipid panel, Hepatic function panel, TSH, CBC with Differential/Platelet, Hemoglobin A1c  Hx pulmonary embolism - Plan: Basic metabolic panel, Lipid panel, Hepatic function panel, TSH, CBC with Differential/Platelet, Hemoglobin A1c  Hyperlipidemia - Plan: Basic metabolic panel, Lipid panel, Hepatic function panel,  TSH, CBC with Differential/Platelet, Hemoglobin A1c  Leukopenia - Plan: Basic metabolic panel, Lipid panel, Hepatic function panel, TSH, CBC with Differential/Platelet, Hemoglobin A1c  Hyperglycemia - Plan: Basic metabolic panel, Lipid panel, Hepatic function panel, TSH, CBC with Differential/Platelet, Hemoglobin A1c  Estrogen deficiency - Plan: DG Bone Density  Recent bereavement - loss of child son fall 2016 sudden SCD?  Elevated BP - fu at rov  Colon mammo dxa hep c  Get colonscopy .  Disc above  Disc  med  Risk benefit of medication discussed.  And plan fu  Extra time  In addition to  Exam related to grief depression and medical conditions   Fu at rov  Patient Care Team: Burnis Medin, MD as PCP - General Latanya Maudlin, MD as Attending Physician (Orthopedic Surgery)  Patient Instructions  Condolences about your son and loss.  We can  Restart a medication that may help depressive sx .  And may be help your sleep  Will notify you  of labs when available. And fu visi t  You are due for mammogram  And you colonsocsopu  Also bone density scan in the next few months or so  We can put in order and you get  appt time.   ROV in 3 weeks  Or as needed .   Stress and Stress Management Stress is a normal reaction  to life events. It is what you feel when life demands more than you are used to or more than you can handle. Some stress can be useful. For example, the stress reaction can help you catch the last bus of the day, study for a test, or meet a deadline at work. But stress that occurs too often or for too long can cause problems. It can affect your emotional health and interfere with relationships and normal daily activities. Too much stress can weaken your immune system and increase your risk for physical illness. If you already have a medical problem, stress can make it worse. CAUSES  All sorts of life events may cause stress. An event that causes stress for one person may not be stressful for another person. Major life events commonly cause stress. These may be positive or negative. Examples include losing your job, moving into a new home, getting married, having a baby, or losing a loved one. Less obvious life events may also cause stress, especially if they occur day after day or in combination. Examples include working long hours, driving in traffic, caring for children, being in debt, or being in a difficult relationship. SIGNS AND SYMPTOMS Stress may cause emotional symptoms including, the following:  Anxiety. This is feeling worried, afraid, on edge, overwhelmed, or out of control.  Anger. This is feeling irritated or impatient.  Depression. This is feeling sad, down, helpless, or guilty.  Difficulty focusing, remembering, or making decisions. Stress may cause physical symptoms, including the following:   Aches and pains. These may affect your head, neck, back, stomach, or other areas of your body.  Tight muscles or clenched jaw.  Low energy or trouble sleeping. Stress may cause unhealthy behaviors, including the following:   Eating to feel better (overeating) or skipping meals.  Sleeping too little, too much, or both.  Working too much or putting off tasks  (procrastination).  Smoking, drinking alcohol, or using drugs to feel better. DIAGNOSIS  Stress is diagnosed through an assessment by your health care provider. Your health care provider will ask questions about your symptoms and any stressful  life events.Your health care provider will also ask about your medical history and may order blood tests or other tests. Certain medical conditions and medicine can cause physical symptoms similar to stress. Mental illness can cause emotional symptoms and unhealthy behaviors similar to stress. Your health care provider may refer you to a mental health professional for further evaluation.  TREATMENT  Stress management is the recommended treatment for stress.The goals of stress management are reducing stressful life events and coping with stress in healthy ways.  Techniques for reducing stressful life events include the following:  Stress identification. Self-monitor for stress and identify what causes stress for you. These skills may help you to avoid some stressful events.  Time management. Set your priorities, keep a calendar of events, and learn to say "no." These tools can help you avoid making too many commitments. Techniques for coping with stress include the following:  Rethinking the problem. Try to think realistically about stressful events rather than ignoring them or overreacting. Try to find the positives in a stressful situation rather than focusing on the negatives.  Exercise. Physical exercise can release both physical and emotional tension. The key is to find a form of exercise you enjoy and do it regularly.  Relaxation techniques. These relax the body and mind. Examples include yoga, meditation, tai chi, biofeedback, deep breathing, progressive muscle relaxation, listening to music, being out in nature, journaling, and other hobbies. Again, the key is to find one or more that you enjoy and can do regularly.  Healthy lifestyle. Eat a  balanced diet, get plenty of sleep, and do not smoke. Avoid using alcohol or drugs to relax.  Strong support network. Spend time with family, friends, or other people you enjoy being around.Express your feelings and talk things over with someone you trust. Counseling or talktherapy with a mental health professional may be helpful if you are having difficulty managing stress on your own. Medicine is typically not recommended for the treatment of stress.Talk to your health care provider if you think you need medicine for symptoms of stress. HOME CARE INSTRUCTIONS  Keep all follow-up visits as directed by your health care provider.  Take all medicines as directed by your health care provider. SEEK MEDICAL CARE IF:  Your symptoms get worse or you start having new symptoms.  You feel overwhelmed by your problems and can no longer manage them on your own. SEEK IMMEDIATE MEDICAL CARE IF:  You feel like hurting yourself or someone else.   This information is not intended to replace advice given to you by your health care provider. Make sure you discuss any questions you have with your health care provider.   Document Released: 05/23/2001 Document Revised: 12/18/2014 Document Reviewed: 07/22/2013 Elsevier Interactive Patient Education [employer].   Complicated Grieving Grief is a normal response to the death of someone close to you. Feelings of fear, anger, and guilt can affect almost everyone who loses a loved one. It is also common to have symptoms of depression while you are grieving. These include problems with sleep, loss of appetite, and lack of energy. They may last for weeks or months after a loss. Complicated grief is different from normal grief or depression. Normal grieving involves sadness and feelings of loss, but these feelings are not constant. Complicated grief is a constant and severe type of grief. It interferes with your ability to function normally. It may last for  several months to a year or longer. Complicated grief may require treatment from  a mental health care provider. CAUSES  It is not known why some people continue to struggle with grief and others do not. You may be at higher risk for complicated grief if:  The death of your loved one was sudden or unexpected.  The death of your loved one was due to a violent event.  Your loved one committed suicide.  Your loved one was a child or a young person.  You were very close to or dependent on the loved one.  You have a history of depression. SIGNS AND SYMPTOMS Signs and symptoms of complicated grief may include:  Feeling disbelief or numbness.  Being unable to enjoy good memories of your loved one.  Needing to avoid anything that reminds you of your loved one.  Being unable to stop thinking about the death.  Feeling intense anger or guilt.  Feeling alone and hopeless.  Feeling that your life is meaningless and empty.  Losing the desire to live. DIAGNOSIS Your health care provider may diagnose complicated grief if:  You have constant symptoms of grief for 6-12 months or longer.  Your symptoms are interfering with your ability to live your life. Your health care provider may want you to see a mental health care provider. Many symptoms of depression are similar to the symptoms of complicated grief. It is important to be evaluated for complicated grief along with other mental health conditions. TREATMENT  Talk therapy with a mental health provider is the most common treatment for complicated grief. During therapy, you will learn healthy ways to cope with the loss of your loved one. In some cases, your mental health care provider may also recommend antidepressant medicines. HOME CARE INSTRUCTIONS  Take care of yourself.  Eat regular meals and maintain a healthy diet. Eat plenty of fruits, vegetables, and whole grains.  Try to get some exercise each day.  Keep regular hours for  sleep. Try to get at least 8 hours of sleep each night.  Do not use drugs or alcohol to ease your symptoms.  Take medicines only as directed by your health care provider.  Spend time with friends and loved ones.  Consider joining a grief (bereavement) support group to help you deal with your loss.  Keep all follow-up visits as directed by your health care provider. This is important. SEEK MEDICAL CARE IF:  Your symptoms keep you from functioning normally.  Your symptoms do not get better with treatment. SEEK IMMEDIATE MEDICAL CARE IF:  You have serious thoughts of hurting yourself or someone else.  You have suicidal feelings.   This information is not intended to replace advice given to you by your health care provider. Make sure you discuss any questions you have with your health care provider.   Document Released: 11/27/2005 Document Revised: 08/18/2015 Document Reviewed: 05/07/2014 Elsevier Interactive Patient Education 2016 [place] K. Panosh M.D.

## 2016-07-12 ENCOUNTER — Encounter: Payer: Self-pay | Admitting: Internal Medicine

## 2016-07-12 ENCOUNTER — Ambulatory Visit (INDEPENDENT_AMBULATORY_CARE_PROVIDER_SITE_OTHER): Payer: Medicare Other | Admitting: Internal Medicine

## 2016-07-12 VITALS — BP 142/100 | Temp 97.9°F | Ht 64.0 in | Wt 162.2 lb

## 2016-07-12 DIAGNOSIS — F4321 Adjustment disorder with depressed mood: Secondary | ICD-10-CM

## 2016-07-12 DIAGNOSIS — D72819 Decreased white blood cell count, unspecified: Secondary | ICD-10-CM | POA: Diagnosis not present

## 2016-07-12 DIAGNOSIS — R030 Elevated blood-pressure reading, without diagnosis of hypertension: Secondary | ICD-10-CM

## 2016-07-12 DIAGNOSIS — Z634 Disappearance and death of family member: Secondary | ICD-10-CM

## 2016-07-12 DIAGNOSIS — Z0000 Encounter for general adult medical examination without abnormal findings: Secondary | ICD-10-CM

## 2016-07-12 DIAGNOSIS — E785 Hyperlipidemia, unspecified: Secondary | ICD-10-CM | POA: Diagnosis not present

## 2016-07-12 DIAGNOSIS — Z86711 Personal history of pulmonary embolism: Secondary | ICD-10-CM

## 2016-07-12 DIAGNOSIS — Z789 Other specified health status: Secondary | ICD-10-CM

## 2016-07-12 DIAGNOSIS — R739 Hyperglycemia, unspecified: Secondary | ICD-10-CM | POA: Diagnosis not present

## 2016-07-12 DIAGNOSIS — E2839 Other primary ovarian failure: Secondary | ICD-10-CM

## 2016-07-12 DIAGNOSIS — IMO0001 Reserved for inherently not codable concepts without codable children: Secondary | ICD-10-CM

## 2016-07-12 LAB — BASIC METABOLIC PANEL
BUN: 11 mg/dL (ref 6–23)
CALCIUM: 9.9 mg/dL (ref 8.4–10.5)
CO2: 28 mEq/L (ref 19–32)
CREATININE: 0.74 mg/dL (ref 0.40–1.20)
Chloride: 105 mEq/L (ref 96–112)
GFR: 82.51 mL/min (ref >60.00)
GLUCOSE: 99 mg/dL (ref 70–99)
POTASSIUM: 4.7 meq/L (ref 3.5–5.1)
Sodium: 141 mEq/L (ref 135–145)

## 2016-07-12 LAB — HEPATIC FUNCTION PANEL
ALT: 16 U/L (ref 0–35)
AST: 13 U/L (ref 0–37)
Albumin: 4.5 g/dL (ref 3.5–5.2)
Alkaline Phosphatase: 84 U/L (ref 39–117)
BILIRUBIN TOTAL: 0.8 mg/dL (ref 0.2–1.2)
Bilirubin, Direct: 0 mg/dL (ref 0.0–0.3)
Total Protein: 7.6 g/dL (ref 6.0–8.3)

## 2016-07-12 LAB — LIPID PANEL
Cholesterol: 280 mg/dL — ABNORMAL HIGH (ref 0–200)
HDL: 63.6 mg/dL (ref >39.00)
LDL Cholesterol: 183 mg/dL — ABNORMAL HIGH (ref 0–99)
NONHDL: 216.6
Total CHOL/HDL Ratio: 4
Triglycerides: 168 mg/dL — ABNORMAL HIGH (ref 0.0–149.0)
VLDL: 33.6 mg/dL (ref 0.0–40.0)

## 2016-07-12 LAB — CBC WITH DIFFERENTIAL/PLATELET
BASOS PCT: 0.7 % (ref 0.0–3.0)
Basophils Absolute: 0 10*3/uL (ref 0.0–0.1)
EOS PCT: 1.3 % (ref 0.0–5.0)
Eosinophils Absolute: 0.1 10*3/uL (ref 0.0–0.7)
HCT: 42 % (ref 36.0–46.0)
HEMOGLOBIN: 14.4 g/dL (ref 12.0–15.0)
LYMPHS ABS: 1.5 10*3/uL (ref 0.7–4.0)
Lymphocytes Relative: 30.1 % (ref 12.0–46.0)
MCHC: 34.2 g/dL (ref 30.0–36.0)
MCV: 88.1 fl (ref 78.0–100.0)
MONOS PCT: 10 % (ref 3.0–12.0)
Monocytes Absolute: 0.5 10*3/uL (ref 0.1–1.0)
NEUTROS PCT: 57.9 % (ref 43.0–77.0)
Neutro Abs: 2.9 10*3/uL (ref 1.4–7.7)
Platelets: 274 10*3/uL (ref 150.0–400.0)
RBC: 4.77 Mil/uL (ref 3.87–5.11)
RDW: 13.1 % (ref 11.5–15.5)
WBC: 5 10*3/uL (ref 4.0–10.5)

## 2016-07-12 LAB — TSH: TSH: 0.57 u[IU]/mL (ref 0.35–4.50)

## 2016-07-12 LAB — HEMOGLOBIN A1C: Hgb A1c MFr Bld: 5.7 % (ref 4.6–6.5)

## 2016-07-12 MED ORDER — SERTRALINE HCL 50 MG PO TABS: ORAL_TABLET | ORAL | 3 refills | Status: DC

## 2016-07-12 NOTE — Patient Instructions (Addendum)
Condolences about your son and loss.  We can  Restart a medication that may help depressive sx .  And may be help your sleep  Will notify you  of labs when available. And fu visi t  You are due for mammogram  And you colonsocsopu  Also bone density scan in the next few months or so  We can put in order and you get  appt time.   ROV in 3 weeks  Or as needed .   Stress and Stress Management Stress is a normal reaction to life events. It is what you feel when life demands more than you are used to or more than you can handle. Some stress can be useful. For example, the stress reaction can help you catch the last bus of the day, study for a test, or meet a deadline at work. But stress that occurs too often or for too long can cause problems. It can affect your emotional health and interfere with relationships and normal daily activities. Too much stress can weaken your immune system and increase your risk for physical illness. If you already have a medical problem, stress can make it worse. CAUSES  All sorts of life events may cause stress. An event that causes stress for one person may not be stressful for another person. Major life events commonly cause stress. These may be positive or negative. Examples include losing your job, moving into a new home, getting married, having a baby, or losing a loved one. Less obvious life events may also cause stress, especially if they occur day after day or in combination. Examples include working long hours, driving in traffic, caring for children, being in debt, or being in a difficult relationship. SIGNS AND SYMPTOMS Stress may cause emotional symptoms including, the following:  Anxiety. This is feeling worried, afraid, on edge, overwhelmed, or out of control.  Anger. This is feeling irritated or impatient.  Depression. This is feeling sad, down, helpless, or guilty.  Difficulty focusing, remembering, or making decisions. Stress may cause physical  symptoms, including the following:   Aches and pains. These may affect your head, neck, back, stomach, or other areas of your body.  Tight muscles or clenched jaw.  Low energy or trouble sleeping. Stress may cause unhealthy behaviors, including the following:   Eating to feel better (overeating) or skipping meals.  Sleeping too little, too much, or both.  Working too much or putting off tasks (procrastination).  Smoking, drinking alcohol, or using drugs to feel better. DIAGNOSIS  Stress is diagnosed through an assessment by your health care provider. Your health care provider will ask questions about your symptoms and any stressful life events.Your health care provider will also ask about your medical history and may order blood tests or other tests. Certain medical conditions and medicine can cause physical symptoms similar to stress. Mental illness can cause emotional symptoms and unhealthy behaviors similar to stress. Your health care provider may refer you to a mental health professional for further evaluation.  TREATMENT  Stress management is the recommended treatment for stress.The goals of stress management are reducing stressful life events and coping with stress in healthy ways.  Techniques for reducing stressful life events include the following:  Stress identification. Self-monitor for stress and identify what causes stress for you. These skills may help you to avoid some stressful events.  Time management. Set your priorities, keep a calendar of events, and learn to say "no." These tools can help you avoid making  too many commitments. Techniques for coping with stress include the following:  Rethinking the problem. Try to think realistically about stressful events rather than ignoring them or overreacting. Try to find the positives in a stressful situation rather than focusing on the negatives.  Exercise. Physical exercise can release both physical and emotional tension.  The key is to find a form of exercise you enjoy and do it regularly.  Relaxation techniques. These relax the body and mind. Examples include yoga, meditation, tai chi, biofeedback, deep breathing, progressive muscle relaxation, listening to music, being out in nature, journaling, and other hobbies. Again, the key is to find one or more that you enjoy and can do regularly.  Healthy lifestyle. Eat a balanced diet, get plenty of sleep, and do not smoke. Avoid using alcohol or drugs to relax.  Strong support network. Spend time with family, friends, or other people you enjoy being around.Express your feelings and talk things over with someone you trust. Counseling or talktherapy with a mental health professional may be helpful if you are having difficulty managing stress on your own. Medicine is typically not recommended for the treatment of stress.Talk to your health care provider if you think you need medicine for symptoms of stress. HOME CARE INSTRUCTIONS  Keep all follow-up visits as directed by your health care provider.  Take all medicines as directed by your health care provider. SEEK MEDICAL CARE IF:  Your symptoms get worse or you start having new symptoms.  You feel overwhelmed by your problems and can no longer manage them on your own. SEEK IMMEDIATE MEDICAL CARE IF:  You feel like hurting yourself or someone else.   This information is not intended to replace advice given to you by your health care provider. Make sure you discuss any questions you have with your health care provider.   Document Released: 05/23/2001 Document Revised: 12/18/2014 Document Reviewed: 07/22/2013 Elsevier Interactive Patient Education [employer].   Complicated Grieving Grief is a normal response to the death of someone close to you. Feelings of fear, anger, and guilt can affect almost everyone who loses a loved one. It is also common to have symptoms of depression while you are grieving.  These include problems with sleep, loss of appetite, and lack of energy. They may last for weeks or months after a loss. Complicated grief is different from normal grief or depression. Normal grieving involves sadness and feelings of loss, but these feelings are not constant. Complicated grief is a constant and severe type of grief. It interferes with your ability to function normally. It may last for several months to a year or longer. Complicated grief may require treatment from a mental health care provider. CAUSES  It is not known why some people continue to struggle with grief and others do not. You may be at higher risk for complicated grief if:  The death of your loved one was sudden or unexpected.  The death of your loved one was due to a violent event.  Your loved one committed suicide.  Your loved one was a child or a young person.  You were very close to or dependent on the loved one.  You have a history of depression. SIGNS AND SYMPTOMS Signs and symptoms of complicated grief may include:  Feeling disbelief or numbness.  Being unable to enjoy good memories of your loved one.  Needing to avoid anything that reminds you of your loved one.  Being unable to stop thinking about the death.  Feeling intense anger or guilt.  Feeling alone and hopeless.  Feeling that your life is meaningless and empty.  Losing the desire to live. DIAGNOSIS Your health care provider may diagnose complicated grief if:  You have constant symptoms of grief for 6-12 months or longer.  Your symptoms are interfering with your ability to live your life. Your health care provider may want you to see a mental health care provider. Many symptoms of depression are similar to the symptoms of complicated grief. It is important to be evaluated for complicated grief along with other mental health conditions. TREATMENT  Talk therapy with a mental health provider is the most common treatment for  complicated grief. During therapy, you will learn healthy ways to cope with the loss of your loved one. In some cases, your mental health care provider may also recommend antidepressant medicines. HOME CARE INSTRUCTIONS  Take care of yourself.  Eat regular meals and maintain a healthy diet. Eat plenty of fruits, vegetables, and whole grains.  Try to get some exercise each day.  Keep regular hours for sleep. Try to get at least 8 hours of sleep each night.  Do not use drugs or alcohol to ease your symptoms.  Take medicines only as directed by your health care provider.  Spend time with friends and loved ones.  Consider joining a grief (bereavement) support group to help you deal with your loss.  Keep all follow-up visits as directed by your health care provider. This is important. SEEK MEDICAL CARE IF:  Your symptoms keep you from functioning normally.  Your symptoms do not get better with treatment. SEEK IMMEDIATE MEDICAL CARE IF:  You have serious thoughts of hurting yourself or someone else.  You have suicidal feelings.   This information is not intended to replace advice given to you by your health care provider. Make sure you discuss any questions you have with your health care provider.   Document Released: 11/27/2005 Document Revised: 08/18/2015 Document Reviewed: 05/07/2014 Elsevier Interactive Patient Education [employer].

## 2016-08-02 NOTE — Progress Notes (Signed)
Pre visit review using our clinic review tool, if applicable. No additional management support is needed unless otherwise documented below in the visit note.  Chief Complaint  Patient presents with  . Follow-up    HPI: Tiffany Lowe 70 y.o.  Fu med   rx at last visit for dep anxiety   Now taking  50 mg per day and has noted   Less crying .     On meds   And when on 50 mg   Had  Lack of energy.   But adapted   takin med at night   Sleep    Need help  All go to bed late.   And wakens   Cant go back .    Used otc sleep aids in past.  Benadryl  Ask what help  Has to stay up to get mom to bed and then wants to sleep in more   Dry mouth and tongue thick .  Using  [employer]. Long term  No dx osa .  ROS: See pertinent positives and negatives per HPI. Had been on zocor in past  No se just ran out?  Past Medical History:  Diagnosis Date  . 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 Chronic back pain   . Depression    mood reactive  . H/O: rheumatic fever    maybe  . Hyperlipidemia   . Right leg pain   . 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 Diverticulitis Mother     with colostomy.   . Alzheimer's disease Father     died 2006  . Coronary artery disease Other   . Hyperlipidemia Other   . Stroke Other     Social History   Social History  . Marital status: Married    Spouse name: N/A  . Number of children: N/A  . Years of education: N/A   Social History Main Topics  . Smoking status: Never Smoker  . Smokeless tobacco: Never Used  . Alcohol use No  . Drug use: No  . Sexual activity: Not Asked   Other Topics Concern  . 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  Now 4 hours per day   X 5 fmla cause of mom  Stopped august 2014 cause of care.    Husband pipe ets.   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 sertraline (ZOLOFT) 50 MG tablet Take 25 mg per day x 1 week then increase to 50 mg per day or As directed. (Patient taking differently: Take 50 mg by mouth daily. ) 30 tablet 3   No facility-administered medications prior to visit.      EXAM:  BP 120/78 (BP Location: Right Arm, Patient Position: Sitting, Cuff Size: Normal)   Temp 98.1 F (36.7 C) (Oral)   Ht 5\' 4"  (1.626 m)   Wt 160 lb 12.8 oz (72.9 kg)   BMI 27.60 kg/m   Body mass index is 27.6 kg/m.  GENERAL: vitals reviewed and listed above, alert, oriented, appears well hydrated and in no acute distress more animated  Will smile HEENT: atraumatic, conjunctiva  clear, no obvious abnormalities on inspection of  external nose and ears MS: moves all extremities without noticeable focal  abnormality PSYCH: pleasant and cooperative, no obvious depression or anxiety Lab Results  Component Value Date   WBC 5.0 07/12/2016   HGB 14.4 07/12/2016   HCT 42.0 07/12/2016   PLT 274.0 07/12/2016   GLUCOSE 99 07/12/2016   CHOL 280 (H) 07/12/2016   TRIG 168.0 (H) 07/12/2016   HDL 63.60 07/12/2016   LDLDIRECT 190.0 09/30/2010   LDLCALC 183 (H) 07/12/2016   ALT 16 07/12/2016   AST 13 07/12/2016   NA 141 07/12/2016   K 4.7 07/12/2016   CL 105 07/12/2016   CREATININE 0.74 07/12/2016   BUN 11 07/12/2016   CO2 28 07/12/2016   TSH 0.57 07/12/2016   INR 3.6 09/15/2014   HGBA1C 5.7 07/12/2016   BP Readings from Last 3 Encounters:  08/03/16 120/78  07/12/16 (!) 142/100  09/24/14 136/80   Wt Readings from Last 3 Encounters:  08/03/16 160 lb 12.8 oz (72.9 kg)  07/12/16 162 lb 3.2 oz (73.6 kg)  09/24/14 184 lb 9.6 oz (83.7 kg)   bp repeated and confirmed 120/78 PHQ-SADS Somatic:3 GAD7: 6 PHQ9: 6 Difficulty : not difficult   ASSESSMENT AND PLAN:  Discussed the following assessment and plan:  Adjustment disorder with depressed mood - looks much better  today  see  phq15 follow  sleep problematic  Hyperlipidemia - up off med trial low dose atorva rehcec in fu   Medication management  Need for prophylactic vaccination and inoculation against influenza - Plan: Flu Vaccine QUAD 36+ mos PF IM (Fluarix & Fluzone Quad PF)  Dry mouth  Sleep disturbance - aggravated by mood inc sert 75 acn try low dose remeron short trial fu stop benadryl for now  worse for dry mouth Total visit 25mins > 50% spent counseling and coordinating care as indicated in above note and in instructions to patient .  BP better      -Patient advised to return or notify health care team  if symptoms worsen ,persist or new concerns arise.  Patient Instructions  Would like you to increase the sertraline to 75 mg a day. Try taking the melatonin before bed if it makes it easier to fall back asleep in the middle the night. If needed you can try short-term Remeron which is an older antidepressant anxiety agent but used them very low doses may help with sleep. If it makes her too groggy don't  Stop it.  Please began new medication for cholesterol. Let us know if there is a problem with taking it. Plan follow-up visit in 6-8 weeks we can consider rechecking your cholesterol that time but we will do a medication check. Many causes of dry mouth including medicines. May want to talk with your dentist about ideas options also.       Neta MendsWanda K. Elysse Polidore M.D.

## 2016-08-03 ENCOUNTER — Encounter: Payer: Self-pay | Admitting: Internal Medicine

## 2016-08-03 ENCOUNTER — Ambulatory Visit (INDEPENDENT_AMBULATORY_CARE_PROVIDER_SITE_OTHER): Payer: Medicare Other | Admitting: Internal Medicine

## 2016-08-03 VITALS — BP 120/78 | Temp 98.1°F | Ht 64.0 in | Wt 160.8 lb

## 2016-08-03 DIAGNOSIS — Z23 Encounter for immunization: Secondary | ICD-10-CM

## 2016-08-03 DIAGNOSIS — E785 Hyperlipidemia, unspecified: Secondary | ICD-10-CM

## 2016-08-03 DIAGNOSIS — G479 Sleep disorder, unspecified: Secondary | ICD-10-CM

## 2016-08-03 DIAGNOSIS — F4321 Adjustment disorder with depressed mood: Secondary | ICD-10-CM | POA: Diagnosis not present

## 2016-08-03 DIAGNOSIS — R682 Dry mouth, unspecified: Secondary | ICD-10-CM

## 2016-08-03 DIAGNOSIS — Z79899 Other long term (current) drug therapy: Secondary | ICD-10-CM | POA: Diagnosis not present

## 2016-08-03 MED ORDER — SERTRALINE HCL 50 MG PO TABS: 75.0000 mg | ORAL_TABLET | Freq: Every day | ORAL | 3 refills | Status: DC

## 2016-08-03 MED ORDER — ATORVASTATIN CALCIUM 10 MG PO TABS
10.0000 mg | ORAL_TABLET | Freq: Every day | ORAL | 1 refills | Status: DC
Start: 2016-08-03 — End: 2016-10-27

## 2016-08-03 MED ORDER — MIRTAZAPINE 7.5 MG PO TABS: 7.5000 mg | ORAL_TABLET | Freq: Every day | ORAL | 1 refills | Status: DC

## 2016-08-03 NOTE — Patient Instructions (Addendum)
Would like you to increase the sertraline to 75 mg a day. Try taking the melatonin before bed if it makes it easier to fall back asleep in the middle the night. If needed you can try short-term Remeron which is an older antidepressant anxiety agent but used them very low doses may help with sleep. If it makes her too groggy don't  Stop it.  Please began new medication for cholesterol. Let us know if there is a problem with taking it. Plan follow-up visit in 6-8 weeks we can consider rechecking your cholesterol that time but we will do a medication check. Many causes of dry mouth including medicines. May want to talk with your dentist about ideas options also.

## 2016-09-05 ENCOUNTER — Other Ambulatory Visit: Payer: Self-pay | Admitting: Internal Medicine

## 2016-09-06 NOTE — Telephone Encounter (Signed)
Last OV 08/03/16... Last Labs 07/12/16... Last refill 08/03/16 #30 with 1 refill.... Okay to refill?  Note from pharmacy states:  Rx filled on 09/05/16.  Any refills authorized will be placed on file.

## 2016-09-06 NOTE — Telephone Encounter (Signed)
Ok to refill x 6 months 

## 2016-09-08 ENCOUNTER — Telehealth: Payer: Self-pay | Admitting: Internal Medicine

## 2016-09-08 NOTE — Telephone Encounter (Signed)
Pt is having a side effect (night sweat not soaking wet) from mirtazapine and only have one left and she would like to know if she should continue to take until she comes in to see you on 09/14/16.  Pt would like to have a call back today if possible.

## 2016-09-11 NOTE — Telephone Encounter (Signed)
Left message on voicemail to call office.  

## 2016-09-11 NOTE — Telephone Encounter (Signed)
If it helps her sleep and anxiety  she can just take it as needed    Or  Ok to try off  until visit but  Stay on the sertraline.

## 2016-09-11 NOTE — Telephone Encounter (Signed)
Please see message and advise 

## 2016-09-13 NOTE — Progress Notes (Signed)
Pre visit review using our clinic review tool, if applicable. No additional management support is needed unless otherwise documented below in the visit note.  Chief Complaint  Patient presents with  . Follow-up    HPI: Tiffany Lowe 70 y.o.  Mood depression sx   Better   Feels bad but  Not stuck  Inc sertralkine to 75   Better  Nose  add remeron?  Only  On lower leg feeling heat feeling.  But not wet  At ngiht   Both legs and sweat feeling without wetness  Lasted Not long.  Night time.  Not now  The Remeron really helped her sleep when she woke up in the middle the night but is very expensive so only got 10 pills. Asks whether melatonin is appropriate she doesn't have a problem falling asleep when she wakes up tingling back to sleep.  Blood pressure seems to be in the 1 40- 43 range when she has checked it. Tries to eat reasonably healthy. ROS: See pertinent positives and negatives per HPI. Hair seems to be shedding but no bald spots or rashes.  Past Medical History:  Diagnosis Date  . 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 Chronic back pain   . Depression    mood reactive  . H/O: rheumatic fever    maybe  . Hyperlipidemia   . Right leg pain   . 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 Diverticulitis Mother     with colostomy.   . Alzheimer's disease Father     died Apr 30, 2005  . Coronary artery disease Other   . Hyperlipidemia Other   . Stroke Other     Social History   Social History  . Marital status: Married    Spouse name: N/A  . Number of children: N/A  . Years of education: N/A   Social History Main Topics  . Smoking status: Never Smoker  . Smokeless tobacco: Never Used  . Alcohol use No  . Drug use: No  . Sexual activity: Not Asked   Other Topics Concern  . 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  Now 4 hours per  day   X 5 fmla cause of mom  Stopped august 2014 cause of care.    Husband pipe ets.   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 atorvastatin (LIPITOR) 10 MG tablet Take 1 tablet (10 mg total) by mouth daily. For cholestserol 90 tablet 1  . mirtazapine (REMERON) 7.5 MG tablet Take 1-2 tablets by mouth at bedtime. 30 tablet 5  . sertraline (ZOLOFT) 50 MG tablet Take 1.5 tablets (75 mg total) by mouth daily. or As directed. 45 tablet 3   No facility-administered medications prior to visit.      EXAM:  BP (!) 144/80 (BP Location: Left Arm, Patient Position: Sitting, Cuff Size: Normal)   Temp 97.8 F (36.6 C) (Oral)   Ht 5\' 4"  (1.626 m)   Wt 162 lb (73.5 kg)   BMI 27.81 kg/m   Body mass index is 27.81 kg/m. Repeat bp 140/88  GENERAL: vitals reviewed and listed above, alert, oriented, appears well hydrated and in no  acute distress HEENT: atraumatic, conjunctiva  clear, no obvious abnormalities on inspection of external nose and ears  NECK: no obvious masses on inspection palpation  LUNGS: clear to auscultation bilaterally, no wheezes, rales or rhonchi, good air movement CV: HRRR, no clubbing cyanosis or  peripheral edema nl cap refill  MS: moves all extremities without noticeable focal  abnormality PSYCH: pleasant and cooperative, no obvious depression or anxiety much improved affect. Lab Results  Component Value Date   WBC 5.0 07/12/2016   HGB 14.4 07/12/2016   HCT 42.0 07/12/2016   PLT 274.0 07/12/2016   GLUCOSE 99 07/12/2016   CHOL 280 (H) 07/12/2016   TRIG 168.0 (H) 07/12/2016   HDL 63.60 07/12/2016   LDLDIRECT 190.0 09/30/2010   LDLCALC 183 (H) 07/12/2016   ALT 16 07/12/2016   AST 13 07/12/2016   NA 141 07/12/2016   K 4.7 07/12/2016   CL 105 07/12/2016   CREATININE 0.74 07/12/2016   BUN 11 07/12/2016   CO2 28 07/12/2016   TSH 0.57 07/12/2016   INR 3.6 09/15/2014   HGBA1C  5.7 07/12/2016   BP Readings from Last 3 Encounters:  09/14/16 (!) 144/80  08/03/16 120/78  07/12/16 (!) 142/100   Wt Readings from Last 3 Encounters:  09/14/16 162 lb (73.5 kg)  08/03/16 160 lb 12.8 oz (72.9 kg)  07/12/16 162 lb 3.2 oz (73.6 kg)    ASSESSMENT AND PLAN:  Discussed the following assessment and plan:  Sleep disturbance  Medication management  Adjustment disorder with depressed mood  Elevated blood pressure reading Overall she's doing much better in regard to mood and sleep. Discussed risks benefits and cost. She can use the Remeron is needed check and other processes. She apparently does not have Medicare part D. Can try melatonin before bed. Consideration medication for blood pressure control however he can work on lifestyle of the next 3 months in follow-up at that time. She will be due for cholesterol recheck on the atorvastatin. We can do this at the next visit a pharmacy contact us for refills in the interim. -Patient advised to return or notify health care team  if symptoms worsen ,persist or new concerns arise.  Patient Instructions  Can use the mirtazapam as needed.   Can take melatonin  Pre sleep .  Ask to see if the 15 mg  Is less expensive    And then can take half as needed.   Stay on 75 mg sertraline.  Life style changes to control BP and ROV in 3 months   Read labels    Can do lipid panel at that visit . Bring your monitor   DASH Eating Plan DASH stands for "Dietary Approaches to Stop Hypertension." The DASH eating plan is a healthy eating plan that has been shown to reduce high blood pressure (hypertension). Additional health benefits may include reducing the risk of type 2 diabetes mellitus, heart disease, and stroke. The DASH eating plan may also help with weight loss. WHAT DO I NEED TO KNOW ABOUT THE DASH EATING PLAN? For the DASH eating plan, you will follow these general guidelines:  Choose foods with a percent daily value for sodium of  less than 5% (as listed on the food label).  Use salt-free seasonings or herbs instead of table salt or sea salt.  Check with your health care provider or pharmacist before using salt substitutes.  Eat lower-sodium products, often labeled as "lower sodium" or "no salt added."  Eat fresh foods.  Eat more  vegetables, fruits, and low-fat dairy products.  Choose whole grains. Look for the word "whole" as the first word in the ingredient list.  Choose fish and skinless chicken or [place] more often than red meat. Limit fish, poultry, and meat to 6 oz (170 g) each day.  Limit sweets, desserts, sugars, and sugary drinks.  Choose heart-healthy fats.  Limit cheese to 1 oz (28 g) per day.  Eat more home-cooked food and less restaurant, buffet, and fast food.  Limit fried foods.  Cook foods using methods other than frying.  Limit canned vegetables. If you do use them, rinse them well to decrease the sodium.  When eating at a restaurant, ask that your food be prepared with less salt, or no salt if possible. WHAT FOODS CAN I EAT? Seek help from a dietitian for individual calorie needs. Grains Whole grain or whole wheat bread. Brown rice. Whole grain or whole wheat pasta. Quinoa, bulgur, and whole grain cereals. Low-sodium cereals. Corn or whole wheat flour tortillas. Whole grain cornbread. Whole grain crackers. Low-sodium crackers. Vegetables Fresh or frozen vegetables (raw, steamed, roasted, or grilled). Low-sodium or reduced-sodium tomato and vegetable juices. Low-sodium or reduced-sodium tomato sauce and paste. Low-sodium or reduced-sodium canned vegetables.  Fruits All fresh, canned (in natural juice), or frozen fruits. Meat and Other Protein Products Ground beef (85% or leaner), grass-fed beef, or beef trimmed of fat. Skinless chicken or [place]. Ground chicken or [place]. Pork trimmed of fat. All fish and seafood. Eggs. Dried beans, peas, or lentils. Unsalted nuts and seeds. Unsalted  canned beans. Dairy Low-fat dairy products, such as skim or 1% milk, 2% or reduced-fat cheeses, low-fat ricotta or cottage cheese, or plain low-fat yogurt. Low-sodium or reduced-sodium cheeses. Fats and Oils Tub margarines without trans fats. Light or reduced-fat mayonnaise and salad dressings (reduced sodium). Avocado. Safflower, olive, or canola oils. Natural peanut or almond butter. Other Unsalted popcorn and pretzels. The items listed above may not be a complete list of recommended foods or beverages. Contact your dietitian for more options. WHAT FOODS ARE NOT RECOMMENDED? Grains White bread. White pasta. White rice. Refined cornbread. Bagels and croissants. Crackers that contain trans fat. Vegetables Creamed or fried vegetables. Vegetables in a cheese sauce. Regular canned vegetables. Regular canned tomato sauce and paste. Regular tomato and vegetable juices. Fruits Dried fruits. Canned fruit in light or heavy syrup. Fruit juice. Meat and Other Protein Products Fatty cuts of meat. Ribs, chicken wings, bacon, sausage, bologna, salami, chitterlings, fatback, hot dogs, bratwurst, and packaged luncheon meats. Salted nuts and seeds. Canned beans with salt. Dairy Whole or 2% milk, cream, half-and-half, and cream cheese. Whole-fat or sweetened yogurt. Full-fat cheeses or blue cheese. Nondairy creamers and whipped toppings. Processed cheese, cheese spreads, or cheese curds. Condiments Onion and garlic salt, seasoned salt, table salt, and sea salt. Canned and packaged gravies. Worcestershire sauce. Tartar sauce. Barbecue sauce. Teriyaki sauce. Soy sauce, including reduced sodium. Steak sauce. Fish sauce. Oyster sauce. Cocktail sauce. Horseradish. Ketchup and mustard. Meat flavorings and tenderizers. Bouillon cubes. Hot sauce. Tabasco sauce. Marinades. Taco seasonings. Relishes. Fats and Oils Butter, stick margarine, lard, shortening, ghee, and bacon fat. Coconut, palm kernel, or palm oils. Regular  salad dressings. Other Pickles and olives. Salted popcorn and pretzels. The items listed above may not be a complete list of foods and beverages to avoid. Contact your dietitian for more information. WHERE CAN I FIND MORE INFORMATION? National Heart, Lung, and Blood Institute: [employer]   This information is not intended to replace  advice given to you by your health care provider. Make sure you discuss any questions you have with your health care provider.   Document Released: 11/16/2011 Document Revised: 12/18/2014 Document Reviewed: 10/01/2013 Elsevier Interactive Patient Education 2016 [employer]     EspyWanda K. Tajae Maiolo M.D.

## 2016-09-14 ENCOUNTER — Telehealth: Payer: Self-pay | Admitting: Internal Medicine

## 2016-09-14 ENCOUNTER — Ambulatory Visit (INDEPENDENT_AMBULATORY_CARE_PROVIDER_SITE_OTHER): Payer: Medicare Other | Admitting: Internal Medicine

## 2016-09-14 ENCOUNTER — Encounter: Payer: Self-pay | Admitting: Internal Medicine

## 2016-09-14 VITALS — BP 144/80 | Temp 97.8°F | Ht 64.0 in | Wt 162.0 lb

## 2016-09-14 DIAGNOSIS — G479 Sleep disorder, unspecified: Secondary | ICD-10-CM | POA: Diagnosis not present

## 2016-09-14 DIAGNOSIS — R030 Elevated blood-pressure reading, without diagnosis of hypertension: Secondary | ICD-10-CM | POA: Diagnosis not present

## 2016-09-14 DIAGNOSIS — Z79899 Other long term (current) drug therapy: Secondary | ICD-10-CM

## 2016-09-14 DIAGNOSIS — F4321 Adjustment disorder with depressed mood: Secondary | ICD-10-CM | POA: Diagnosis not present

## 2016-09-14 NOTE — Telephone Encounter (Signed)
Spoke to the pt.  Informed her to take medication prn for sleep and anxiety if it helps.  Pt stated she is coming today to see Dr. Fabian SharpPanosh at 10AM.  Will discuss at appt.

## 2016-09-14 NOTE — Telephone Encounter (Signed)
Pt needs new rx  Sertraline 75 mg (one 50 mg and half) 30 day supply w/refills

## 2016-09-14 NOTE — Patient Instructions (Addendum)
Can use the mirtazapam as needed.   Can take melatonin  Pre sleep .  Ask to see if the 15 mg  Is less expensive    And then can take half as needed.   Stay on 75 mg sertraline.  Life style changes to control BP and ROV in 3 months   Read labels    Can do lipid panel at that visit . Bring your monitor   DASH Eating Plan DASH stands for "Dietary Approaches to Stop Hypertension." The DASH eating plan is a healthy eating plan that has been shown to reduce high blood pressure (hypertension). Additional health benefits may include reducing the risk of type 2 diabetes mellitus, heart disease, and stroke. The DASH eating plan may also help with weight loss. WHAT DO I NEED TO KNOW ABOUT THE DASH EATING PLAN? For the DASH eating plan, you will follow these general guidelines:  Choose foods with a percent daily value for sodium of less than 5% (as listed on the food label).  Use salt-free seasonings or herbs instead of table salt or sea salt.  Check with your health care provider or pharmacist before using salt substitutes.  Eat lower-sodium products, often labeled as "lower sodium" or "no salt added."  Eat fresh foods.  Eat more vegetables, fruits, and low-fat dairy products.  Choose whole grains. Look for the word "whole" as the first word in the ingredient list.  Choose fish and skinless chicken or [place] more often than red meat. Limit fish, poultry, and meat to 6 oz (170 g) each day.  Limit sweets, desserts, sugars, and sugary drinks.  Choose heart-healthy fats.  Limit cheese to 1 oz (28 g) per day.  Eat more home-cooked food and less restaurant, buffet, and fast food.  Limit fried foods.  Cook foods using methods other than frying.  Limit canned vegetables. If you do use them, rinse them well to decrease the sodium.  When eating at a restaurant, ask that your food be prepared with less salt, or no salt if possible. WHAT FOODS CAN I EAT? Seek help from a dietitian for  individual calorie needs. Grains Whole grain or whole wheat bread. Brown rice. Whole grain or whole wheat pasta. Quinoa, bulgur, and whole grain cereals. Low-sodium cereals. Corn or whole wheat flour tortillas. Whole grain cornbread. Whole grain crackers. Low-sodium crackers. Vegetables Fresh or frozen vegetables (raw, steamed, roasted, or grilled). Low-sodium or reduced-sodium tomato and vegetable juices. Low-sodium or reduced-sodium tomato sauce and paste. Low-sodium or reduced-sodium canned vegetables.  Fruits All fresh, canned (in natural juice), or frozen fruits. Meat and Other Protein Products Ground beef (85% or leaner), grass-fed beef, or beef trimmed of fat. Skinless chicken or [place]. Ground chicken or [place]. Pork trimmed of fat. All fish and seafood. Eggs. Dried beans, peas, or lentils. Unsalted nuts and seeds. Unsalted canned beans. Dairy Low-fat dairy products, such as skim or 1% milk, 2% or reduced-fat cheeses, low-fat ricotta or cottage cheese, or plain low-fat yogurt. Low-sodium or reduced-sodium cheeses. Fats and Oils Tub margarines without trans fats. Light or reduced-fat mayonnaise and salad dressings (reduced sodium). Avocado. Safflower, olive, or canola oils. Natural peanut or almond butter. Other Unsalted popcorn and pretzels. The items listed above may not be a complete list of recommended foods or beverages. Contact your dietitian for more options. WHAT FOODS ARE NOT RECOMMENDED? Grains White bread. White pasta. White rice. Refined cornbread. Bagels and croissants. Crackers that contain trans fat. Vegetables Creamed or fried vegetables. Vegetables in a  cheese sauce. Regular canned vegetables. Regular canned tomato sauce and paste. Regular tomato and vegetable juices. Fruits Dried fruits. Canned fruit in light or heavy syrup. Fruit juice. Meat and Other Protein Products Fatty cuts of meat. Ribs, chicken wings, bacon, sausage, bologna, salami, chitterlings, fatback, hot  dogs, bratwurst, and packaged luncheon meats. Salted nuts and seeds. Canned beans with salt. Dairy Whole or 2% milk, cream, half-and-half, and cream cheese. Whole-fat or sweetened yogurt. Full-fat cheeses or blue cheese. Nondairy creamers and whipped toppings. Processed cheese, cheese spreads, or cheese curds. Condiments Onion and garlic salt, seasoned salt, table salt, and sea salt. Canned and packaged gravies. Worcestershire sauce. Tartar sauce. Barbecue sauce. Teriyaki sauce. Soy sauce, including reduced sodium. Steak sauce. Fish sauce. Oyster sauce. Cocktail sauce. Horseradish. Ketchup and mustard. Meat flavorings and tenderizers. Bouillon cubes. Hot sauce. Tabasco sauce. Marinades. Taco seasonings. Relishes. Fats and Oils Butter, stick margarine, lard, shortening, ghee, and bacon fat. Coconut, palm kernel, or palm oils. Regular salad dressings. Other Pickles and olives. Salted popcorn and pretzels. The items listed above may not be a complete list of foods and beverages to avoid. Contact your dietitian for more information. WHERE CAN I FIND MORE INFORMATION? National Heart, Lung, and Blood Institute: travelstabloid.com   This information is not intended to replace advice given to you by your health care provider. Make sure you discuss any questions you have with your health care provider.   Document Released: 11/16/2011 Document Revised: 12/18/2014 Document Reviewed: 10/01/2013 Elsevier Interactive Patient Education [employer].

## 2016-09-15 MED ORDER — SERTRALINE HCL 50 MG PO TABS: 75.0000 mg | ORAL_TABLET | Freq: Every day | ORAL | 2 refills | Status: DC

## 2016-09-15 NOTE — Telephone Encounter (Signed)
Sent to the pharmacy by e-scribe. 

## 2016-10-27 ENCOUNTER — Other Ambulatory Visit: Payer: Self-pay | Admitting: Internal Medicine

## 2016-10-27 NOTE — Telephone Encounter (Signed)
Sent to the pharmacy by e-scribe for 6 months. 

## 2016-11-29 ENCOUNTER — Other Ambulatory Visit: Payer: Self-pay | Admitting: Internal Medicine

## 2016-11-29 NOTE — Telephone Encounter (Signed)
Sent to the pharmacy by e-scribe.  Pt has upcoming follow up on 12/20/16.

## 2016-12-19 NOTE — Progress Notes (Signed)
Pre visit review using our clinic review tool, if applicable. No additional management support is needed unless otherwise documented below in the visit note.  Chief Complaint  Patient presents with  . Follow-up    HPI: Tiffany Lowe 71 y.o.  mfu Tiffany Lowe  comes in today for follow up of  multiple medical problems.    LIPIDS taking every day .    No eating  BP   Off and on  146  And 135 at home .  Has monitor  SLEEP: and  Stress  Some better   Taking as needed  remeron and then  Helps with patience with mom caretaking  Has anosmia and tast buds sweet s   Knows she can lose weight  .   ROS: See pertinent positives and negatives per HPI. No cp sob   Past Medical History:  Diagnosis Date  . 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 Chronic back pain   . Depression    mood reactive  . H/O: rheumatic fever    maybe  . Hyperlipidemia   . Right leg pain   . 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 Diverticulitis Mother     with colostomy.   . Alzheimer's disease Father     died 04-24-05  . Coronary artery disease Other   . Hyperlipidemia Other   . Stroke Other     Social History   Social History  . Marital status: Married    Spouse name: N/A  . Number of children: N/A  . Years of education: N/A   Social History Main Topics  . Smoking status: Never Smoker  . Smokeless tobacco: Never Used  . Alcohol use No  . Drug use: No  . Sexual activity: Not Asked   Other Topics Concern  . 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  Now 4 hours per day   X 5 fmla cause of mom  Stopped august 2014 cause of care.    Husband pipe ets.   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 atorvastatin (LIPITOR) 10 MG tablet TAKE 1 TABLET BY MOUTH daily FOR  cholesterol 90 tablet 0  . mirtazapine (REMERON) 7.5 MG tablet Take 1-2 tablets by mouth at bedtime. 30 tablet 5  . sertraline (ZOLOFT) 50 MG tablet TAKE ONE AND A HALF TABLETS BY MOUTH EVERY DAY OR AS DIRECTED 45 tablet 2   No facility-administered medications prior to visit.      EXAM:  BP 134/79   Temp 97.9 F (36.6 C) (Oral)   Ht 5\' 4"  (1.626 m)   Wt 168 lb (76.2 kg)   BMI 28.84 kg/m   Body mass index is 28.84 kg/m. bp readings 137/84  left her monitor  Office  Large   134/79   Office reg 140/78 GENERAL: vitals reviewed and listed above, alert, oriented, appears well hydrated and in no acute distress   CV: HRRR, no clubbing cyanosis or  peripheral edema nl cap refill  MS: moves all extremities without noticeable focal  abnormality PSYCH: pleasant and cooperative, no obvious depression or anxiety Lab Results  Component Value Date   WBC 5.0  07/12/2016   HGB 14.4 07/12/2016   HCT 42.0 07/12/2016   PLT 274.0 07/12/2016   GLUCOSE 99 07/12/2016   CHOL 280 (H) 07/12/2016   TRIG 168.0 (H) 07/12/2016   HDL 63.60 07/12/2016   LDLDIRECT 190.0 09/30/2010   LDLCALC 183 (H) 07/12/2016   ALT 16 07/12/2016   AST 13 07/12/2016   NA 141 07/12/2016   K 4.7 07/12/2016   CL 105 07/12/2016   CREATININE 0.74 07/12/2016   BUN 11 07/12/2016   CO2 28 07/12/2016   TSH 0.57 07/12/2016   INR 3.6 09/15/2014   HGBA1C 5.7 07/12/2016   BP Readings from Last 3 Encounters:  12/20/16 134/79  09/14/16 (!) 144/80  08/03/16 120/78   Wt Readings from Last 3 Encounters:  12/20/16 168 lb (76.2 kg)  09/14/16 162 lb (73.5 kg)  08/03/16 160 lb 12.8 oz (72.9 kg)    ASSESSMENT AND PLAN:  Discussed the following assessment and plan:  Elevated blood pressure reading  Medication management - Plan: Lipid panel  Sleep disturbance  Hyperlipidemia, unspecified hyperlipidemia type - Plan: Lipid panel Seems to be doing much better. Okay to take the low-dose Remeron every night as it may also be  helping with her caretaking. Discussed the lifestyle she is back on her Lipitor check lipid panel today. Discussed strategies about blood pressure consider adding medication but we can reemphasize tensive by lifestyle. Discussed new blood pressure goals with recent recommendations. Her blood pressure machine is good enough to make decisions. RV 4 months with blood pressure readings earlier if needed refilled medications today. Total visit 25mins > 50% spent counseling and coordinating care as indicated in above note and in instructions to patient .  -Patient advised to return or notify health care team  if symptoms worsen ,persist or new concerns arise.  Patient Instructions  Ok to  Take  Med every night   remeron.  bp goal  Is now lower  To 130/80 .   Your machine is good enough to  Help decide on management. Marland Kitchen. bp 137/84 and 134/79 Try dash diet or mediterranean diet .  15 min walking   Can help   today  Will notify you  of labs when available.   rov in 4 months or as needed  With BP readings    DASH Eating Plan DASH stands for "Dietary Approaches to Stop Hypertension." The DASH eating plan is a healthy eating plan that has been shown to reduce high blood pressure (hypertension). Additional health benefits may include reducing the risk of type 2 diabetes mellitus, heart disease, and stroke. The DASH eating plan may also help with weight loss. What do I need to know about the DASH eating plan? For the DASH eating plan, you will follow these general guidelines:  Choose foods with less than 150 milligrams of sodium per serving (as listed on the food label).  Use salt-free seasonings or herbs instead of table salt or sea salt.  Check with your health care provider or pharmacist before using salt substitutes.  Eat lower-sodium products. These are often labeled as "low-sodium" or "no salt added."  Eat fresh foods. Avoid eating a lot of canned foods.  Eat more vegetables, fruits, and  low-fat dairy products.  Choose whole grains. Look for the word "whole" as the first word in the ingredient list.  Choose fish and skinless chicken or [place] more often than red meat. Limit fish, poultry, and meat to 6 oz (170 g) each day.  Limit sweets,  desserts, sugars, and sugary drinks.  Choose heart-healthy fats.  Eat more home-cooked food and less restaurant, buffet, and fast food.  Limit fried foods.  Do not fry foods. Cook foods using methods such as baking, boiling, grilling, and broiling instead.  When eating at a restaurant, ask that your food be prepared with less salt, or no salt if possible. What foods can I eat? Seek help from a dietitian for individual calorie needs. Grains  Whole grain or whole wheat bread. Brown rice. Whole grain or whole wheat pasta. Quinoa, bulgur, and whole grain cereals. Low-sodium cereals. Corn or whole wheat flour tortillas. Whole grain cornbread. Whole grain crackers. Low-sodium crackers. Vegetables  Fresh or frozen vegetables (raw, steamed, roasted, or grilled). Low-sodium or reduced-sodium tomato and vegetable juices. Low-sodium or reduced-sodium tomato sauce and paste. Low-sodium or reduced-sodium canned vegetables. Fruits  All fresh, canned (in natural juice), or frozen fruits. Meat and Other Protein Products  Ground beef (85% or leaner), grass-fed beef, or beef trimmed of fat. Skinless chicken or [place]. Ground chicken or [place]. Pork trimmed of fat. All fish and seafood. Eggs. Dried beans, peas, or lentils. Unsalted nuts and seeds. Unsalted canned beans. Dairy  Low-fat dairy products, such as skim or 1% milk, 2% or reduced-fat cheeses, low-fat ricotta or cottage cheese, or plain low-fat yogurt. Low-sodium or reduced-sodium cheeses. Fats and Oils  Tub margarines without trans fats. Light or reduced-fat mayonnaise and salad dressings (reduced sodium). Avocado. Safflower, olive, or canola oils. Natural peanut or almond butter. Other    Unsalted popcorn and pretzels. The items listed above may not be a complete list of recommended foods or beverages. Contact your dietitian for more options.  What foods are not recommended? Grains  White bread. White pasta. White rice. Refined cornbread. Bagels and croissants. Crackers that contain trans fat. Vegetables  Creamed or fried vegetables. Vegetables in a cheese sauce. Regular canned vegetables. Regular canned tomato sauce and paste. Regular tomato and vegetable juices. Fruits  Canned fruit in light or heavy syrup. Fruit juice. Meat and Other Protein Products  Fatty cuts of meat. Ribs, chicken wings, bacon, sausage, bologna, salami, chitterlings, fatback, hot dogs, bratwurst, and packaged luncheon meats. Salted nuts and seeds. Canned beans with salt. Dairy  Whole or 2% milk, cream, half-and-half, and cream cheese. Whole-fat or sweetened yogurt. Full-fat cheeses or blue cheese. Nondairy creamers and whipped toppings. Processed cheese, cheese spreads, or cheese curds. Condiments  Onion and garlic salt, seasoned salt, table salt, and sea salt. Canned and packaged gravies. Worcestershire sauce. Tartar sauce. Barbecue sauce. Teriyaki sauce. Soy sauce, including reduced sodium. Steak sauce. Fish sauce. Oyster sauce. Cocktail sauce. Horseradish. Ketchup and mustard. Meat flavorings and tenderizers. Bouillon cubes. Hot sauce. Tabasco sauce. Marinades. Taco seasonings. Relishes. Fats and Oils  Butter, stick margarine, lard, shortening, ghee, and bacon fat. Coconut, palm kernel, or palm oils. Regular salad dressings. Other  Pickles and olives. Salted popcorn and pretzels. The items listed above may not be a complete list of foods and beverages to avoid. Contact your dietitian for more information.  Where can I find more information? National Heart, Lung, and Blood Institute: [employer].it This information is not intended to replace advice given to you by  your health care provider. Make sure you discuss any questions you have with your health care provider. Document Released: 11/16/2011 Document Revised: 05/04/2016 Document Reviewed: 10/01/2013 Elsevier Interactive Patient Education  2017 [employer].      [place]. Harrington Jobe M.D.

## 2016-12-20 ENCOUNTER — Ambulatory Visit (INDEPENDENT_AMBULATORY_CARE_PROVIDER_SITE_OTHER): Payer: PPO | Admitting: Internal Medicine

## 2016-12-20 ENCOUNTER — Encounter: Payer: Self-pay | Admitting: Internal Medicine

## 2016-12-20 VITALS — BP 134/79 | Temp 97.9°F | Ht 64.0 in | Wt 168.0 lb

## 2016-12-20 DIAGNOSIS — R030 Elevated blood-pressure reading, without diagnosis of hypertension: Secondary | ICD-10-CM

## 2016-12-20 DIAGNOSIS — E785 Hyperlipidemia, unspecified: Secondary | ICD-10-CM

## 2016-12-20 DIAGNOSIS — Z79899 Other long term (current) drug therapy: Secondary | ICD-10-CM | POA: Diagnosis not present

## 2016-12-20 DIAGNOSIS — G479 Sleep disorder, unspecified: Secondary | ICD-10-CM | POA: Diagnosis not present

## 2016-12-20 LAB — LIPID PANEL
Cholesterol: 233 mg/dL — ABNORMAL HIGH (ref 0–200)
HDL: 71.4 mg/dL (ref >39.00)
LDL Cholesterol: 138 mg/dL — ABNORMAL HIGH (ref 0–99)
NONHDL: 161.92
Total CHOL/HDL Ratio: 3
Triglycerides: 119 mg/dL (ref 0.0–149.0)
VLDL: 23.8 mg/dL (ref 0.0–40.0)

## 2016-12-20 MED ORDER — MIRTAZAPINE 7.5 MG PO TABS: 7.5000 mg | ORAL_TABLET | Freq: Every day | ORAL | 3 refills | Status: DC

## 2016-12-20 MED ORDER — SERTRALINE HCL 50 MG PO TABS: ORAL_TABLET | ORAL | 2 refills | Status: DC

## 2016-12-20 NOTE — Patient Instructions (Addendum)
Ok to  Take  Med every night   remeron.  bp goal  Is now lower  To 130/80 .   Your machine is good enough to  Help decide on management. Marland Kitchen. bp 137/84 and 134/79 Try dash diet or mediterranean diet .  15 min walking   Can help   today  Will notify you  of labs when available.   rov in 4 months or as needed  With BP readings    DASH Eating Plan DASH stands for "Dietary Approaches to Stop Hypertension." The DASH eating plan is a healthy eating plan that has been shown to reduce high blood pressure (hypertension). Additional health benefits may include reducing the risk of type 2 diabetes mellitus, heart disease, and stroke. The DASH eating plan may also help with weight loss. What do I need to know about the DASH eating plan? For the DASH eating plan, you will follow these general guidelines:  Choose foods with less than 150 milligrams of sodium per serving (as listed on the food label).  Use salt-free seasonings or herbs instead of table salt or sea salt.  Check with your health care provider or pharmacist before using salt substitutes.  Eat lower-sodium products. These are often labeled as "low-sodium" or "no salt added."  Eat fresh foods. Avoid eating a lot of canned foods.  Eat more vegetables, fruits, and low-fat dairy products.  Choose whole grains. Look for the word "whole" as the first word in the ingredient list.  Choose fish and skinless chicken or [place] more often than red meat. Limit fish, poultry, and meat to 6 oz (170 g) each day.  Limit sweets, desserts, sugars, and sugary drinks.  Choose heart-healthy fats.  Eat more home-cooked food and less restaurant, buffet, and fast food.  Limit fried foods.  Do not fry foods. Cook foods using methods such as baking, boiling, grilling, and broiling instead.  When eating at a restaurant, ask that your food be prepared with less salt, or no salt if possible. What foods can I eat? Seek help from a dietitian for individual  calorie needs. Grains  Whole grain or whole wheat bread. Brown rice. Whole grain or whole wheat pasta. Quinoa, bulgur, and whole grain cereals. Low-sodium cereals. Corn or whole wheat flour tortillas. Whole grain cornbread. Whole grain crackers. Low-sodium crackers. Vegetables  Fresh or frozen vegetables (raw, steamed, roasted, or grilled). Low-sodium or reduced-sodium tomato and vegetable juices. Low-sodium or reduced-sodium tomato sauce and paste. Low-sodium or reduced-sodium canned vegetables. Fruits  All fresh, canned (in natural juice), or frozen fruits. Meat and Other Protein Products  Ground beef (85% or leaner), grass-fed beef, or beef trimmed of fat. Skinless chicken or [place]. Ground chicken or [place]. Pork trimmed of fat. All fish and seafood. Eggs. Dried beans, peas, or lentils. Unsalted nuts and seeds. Unsalted canned beans. Dairy  Low-fat dairy products, such as skim or 1% milk, 2% or reduced-fat cheeses, low-fat ricotta or cottage cheese, or plain low-fat yogurt. Low-sodium or reduced-sodium cheeses. Fats and Oils  Tub margarines without trans fats. Light or reduced-fat mayonnaise and salad dressings (reduced sodium). Avocado. Safflower, olive, or canola oils. Natural peanut or almond butter. Other  Unsalted popcorn and pretzels. The items listed above may not be a complete list of recommended foods or beverages. Contact your dietitian for more options.  What foods are not recommended? Grains  White bread. White pasta. White rice. Refined cornbread. Bagels and croissants. Crackers that contain trans fat. Vegetables  Creamed or  fried vegetables. Vegetables in a cheese sauce. Regular canned vegetables. Regular canned tomato sauce and paste. Regular tomato and vegetable juices. Fruits  Canned fruit in light or heavy syrup. Fruit juice. Meat and Other Protein Products  Fatty cuts of meat. Ribs, chicken wings, bacon, sausage, bologna, salami, chitterlings, fatback, hot dogs,  bratwurst, and packaged luncheon meats. Salted nuts and seeds. Canned beans with salt. Dairy  Whole or 2% milk, cream, half-and-half, and cream cheese. Whole-fat or sweetened yogurt. Full-fat cheeses or blue cheese. Nondairy creamers and whipped toppings. Processed cheese, cheese spreads, or cheese curds. Condiments  Onion and garlic salt, seasoned salt, table salt, and sea salt. Canned and packaged gravies. Worcestershire sauce. Tartar sauce. Barbecue sauce. Teriyaki sauce. Soy sauce, including reduced sodium. Steak sauce. Fish sauce. Oyster sauce. Cocktail sauce. Horseradish. Ketchup and mustard. Meat flavorings and tenderizers. Bouillon cubes. Hot sauce. Tabasco sauce. Marinades. Taco seasonings. Relishes. Fats and Oils  Butter, stick margarine, lard, shortening, ghee, and bacon fat. Coconut, palm kernel, or palm oils. Regular salad dressings. Other  Pickles and olives. Salted popcorn and pretzels. The items listed above may not be a complete list of foods and beverages to avoid. Contact your dietitian for more information.  Where can I find more information? National Heart, Lung, and Blood Institute: [employer]Prom[employer] This information is not intended to replace advice given to you by your health care provider. Make sure you discuss any questions you have with your health care provider. Document Released: 11/16/2011 Document Revised: 05/04/2016 Document Reviewed: 10/01/2013 Elsevier Interactive Patient Education  2017 [employer].

## 2017-04-18 NOTE — Progress Notes (Signed)
Chief Complaint  Patient presents with  . Follow-up    HPI: Tiffany Lowe 71 y.o. come in for Chronic disease management  meds eval and bp readings   Anxiety sleep  Ok mom is still difficult to caretake  alsoi care taking husband  Stress but thinks sertraline 75 is the ok dose for now.  BP  Readings  Listed 3 at range 128 130 over 70 80  Rest above 140 and some 150 160  . Pulse in 70s  ROS: See pertinent positives and negatives per HPI. No cp sob falling   New sx   Past Medical History:  Diagnosis Date  . 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 Chronic back pain   . Depression    mood reactive  . H/O: rheumatic fever    maybe  . Hyperlipidemia   . Right leg pain   . 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 Diverticulitis Mother        with colostomy.   . Alzheimer's disease Father        died 04-25-05  . Coronary artery disease Other   . Hyperlipidemia Other   . Stroke Other     Social History   Social History  . Marital status: Married    Spouse name: N/A  . Number of children: N/A  . Years of education: N/A   Social History Main Topics  . Smoking status: Never Smoker  . Smokeless tobacco: Never Used  . Alcohol use No  . Drug use: No  . Sexual activity: Not Asked   Other Topics Concern  . 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  Now 4 hours per day   X 5 fmla cause of mom  Stopped august 2014 cause of care.    Husband pipe ets.   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 atorvastatin (LIPITOR) 10 MG tablet TAKE 1 TABLET BY MOUTH daily FOR cholesterol 90 tablet 0  . mirtazapine (REMERON) 7.5 MG tablet Take 1 tablet (7.5 mg total) by mouth at bedtime. 90 tablet 3  . sertraline (ZOLOFT) 50 MG tablet TAKE ONE AND A HALF TABLETS  BY MOUTH EVERY DAY OR AS DIRECTED 135 tablet 2   No facility-administered medications prior to visit.      EXAM:  BP 120/70 (BP Location: Right Arm, Patient Position: Sitting, Cuff Size: Normal)   Pulse 77   Temp 97.7 F (36.5 C) (Oral)   Ht 5\' 4"  (1.626 m)   Wt 174 lb 12.8 oz (79.3 kg)   BMI 30.00 kg/m   Body mass index is 30 kg/m.  GENERAL: vitals reviewed and listed above, alert, oriented, appears well hydrated and in no acute distress HEENT: atraumatic, conjunctiva  clear, no obvious abnormalities on inspection of external nose and ears MS: moves all extremities without noticeable focal  abnormality PSYCH: pleasant and cooperative, no obvious depression or anxiety Lab Results  Component Value Date   WBC 5.0 07/12/2016   HGB 14.4 07/12/2016   HCT 42.0 07/12/2016   PLT 274.0 07/12/2016   GLUCOSE 99 07/12/2016   CHOL 233 (H) 12/20/2016   TRIG 119.0 12/20/2016  HDL 71.40 12/20/2016   LDLDIRECT 190.0 09/30/2010   LDLCALC 138 (H) 12/20/2016   ALT 16 07/12/2016   AST 13 07/12/2016   NA 141 07/12/2016   K 4.7 07/12/2016   CL 105 07/12/2016   CREATININE 0.74 07/12/2016   BUN 11 07/12/2016   CO2 28 07/12/2016   TSH 0.57 07/12/2016   INR 3.6 09/15/2014   HGBA1C 5.7 07/12/2016   BP Readings from Last 3 Encounters:  04/19/17 120/70  12/20/16 134/79  09/14/16 (!) 144/80   Wt Readings from Last 3 Encounters:  04/19/17 174 lb 12.8 oz (79.3 kg)  12/20/16 168 lb (76.2 kg)  09/14/16 162 lb (73.5 kg)    ASSESSMENT AND PLAN:  Discussed the following assessment and plan:  Essential hypertension - Not at goal majority of readings out of office are not at goal discussed option low-dose antihypertensive get us readings in 3-4 weeks if okay recheck it yearly  Medication management  Adjustment disorder with depressed mood  Risk benefit of medication discussed.  Avoid hypotension     Stay on statin  Total visit 25mins > 50% spent counseling and coordinating care as  indicated in above note and in instructions to patient .  Caretaker stress   Coping   -Patient advised to return or notify health care team  if  new concerns arise.  Patient Instructions  Begin  Low dose amlodipine 2.5 mg at night   Check bp readings after  A few weeks  Or so and report to us.   If  Closer to goal below 130/80 then continue and see you in  6 month.   s    Neta MendsWanda K. Florencio Hollibaugh M.D.

## 2017-04-19 ENCOUNTER — Encounter: Payer: Self-pay | Admitting: Internal Medicine

## 2017-04-19 ENCOUNTER — Ambulatory Visit (INDEPENDENT_AMBULATORY_CARE_PROVIDER_SITE_OTHER): Payer: PPO | Admitting: Internal Medicine

## 2017-04-19 VITALS — BP 120/70 | HR 77 | Temp 97.7°F | Ht 64.0 in | Wt 174.8 lb

## 2017-04-19 DIAGNOSIS — Z79899 Other long term (current) drug therapy: Secondary | ICD-10-CM | POA: Diagnosis not present

## 2017-04-19 DIAGNOSIS — I10 Essential (primary) hypertension: Secondary | ICD-10-CM

## 2017-04-19 DIAGNOSIS — F4321 Adjustment disorder with depressed mood: Secondary | ICD-10-CM

## 2017-04-19 MED ORDER — AMLODIPINE BESYLATE 2.5 MG PO TABS: 2.5000 mg | ORAL_TABLET | Freq: Every day | ORAL | 1 refills | Status: DC

## 2017-04-19 NOTE — Patient Instructions (Addendum)
Begin  Low dose amlodipine 2.5 mg at night   Check bp readings after  A few weeks  Or so and report to us.   If  Closer to goal below 130/80 then continue and see you in  6 month.   s

## 2017-05-21 ENCOUNTER — Other Ambulatory Visit: Payer: Self-pay | Admitting: Internal Medicine

## 2017-07-05 ENCOUNTER — Other Ambulatory Visit: Payer: Self-pay | Admitting: Internal Medicine

## 2017-07-27 ENCOUNTER — Other Ambulatory Visit: Payer: Self-pay | Admitting: Internal Medicine

## 2017-09-05 ENCOUNTER — Other Ambulatory Visit: Payer: Self-pay | Admitting: Internal Medicine

## 2017-09-07 ENCOUNTER — Encounter: Payer: Self-pay | Admitting: *Deleted

## 2017-09-07 NOTE — Telephone Encounter (Signed)
Medication filled to pharmacy as requested.   

## 2017-09-07 NOTE — Telephone Encounter (Signed)
This encounter was created in error - please disregard.

## 2017-09-18 ENCOUNTER — Encounter: Payer: Self-pay | Admitting: Internal Medicine

## 2017-09-18 ENCOUNTER — Ambulatory Visit (INDEPENDENT_AMBULATORY_CARE_PROVIDER_SITE_OTHER): Payer: PPO | Admitting: Internal Medicine

## 2017-09-18 VITALS — BP 122/82 | HR 114 | Temp 98.0°F | Wt 175.6 lb

## 2017-09-18 DIAGNOSIS — Z23 Encounter for immunization: Secondary | ICD-10-CM | POA: Diagnosis not present

## 2017-09-18 DIAGNOSIS — R319 Hematuria, unspecified: Secondary | ICD-10-CM

## 2017-09-18 DIAGNOSIS — R300 Dysuria: Secondary | ICD-10-CM | POA: Diagnosis not present

## 2017-09-18 DIAGNOSIS — N390 Urinary tract infection, site not specified: Secondary | ICD-10-CM | POA: Diagnosis not present

## 2017-09-18 LAB — POCT URINALYSIS DIPSTICK
Bilirubin, UA: NEGATIVE
Glucose, UA: NEGATIVE
Ketones, UA: NEGATIVE
NITRITE UA: NEGATIVE
PH UA: 5.5 (ref 5.0–8.0)
Spec Grav, UA: >=1.03 — AB (ref 1.010–1.025)
UROBILINOGEN UA: 0.2 U/dL

## 2017-09-18 MED ORDER — SULFAMETHOXAZOLE-TRIMETHOPRIM 800-160 MG PO TABS: 1.0000 | ORAL_TABLET | Freq: Two times a day (BID) | ORAL | 1 refills | Status: DC

## 2017-09-18 NOTE — Progress Notes (Signed)
Chief Complaint  Patient presents with  . Painful Urination    Pt c/o painful urination x 4-5 days and pain in lower back. Pt states that the frequency and urgency is there but at times she will not have much volume. Notes she felt feverish last night but did not take her temp.    HPI: Tiffany Lowe 71 y.o.  sda  3-4 days   Using cranberry     Rx. And back hurt and   No fever  But felt warm.  Hard to pee  Small amount urgency but no volume eating nl doesn't drink a lot anyway   No hematuria  r lbp last night  No vomiting diarrhea   ROS: See pertinent positives and negatives per HPI.  Past Medical History:  Diagnosis Date  . 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 Chronic back pain   . Depression    mood reactive  . H/O: rheumatic fever    maybe  . Hyperlipidemia   . Right leg pain   . 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 Diverticulitis Mother        with colostomy.   . Alzheimer's disease Father        died 04-30-2005  . Coronary artery disease Other   . Hyperlipidemia Other   . Stroke Other     Social History   Social History  . Marital status: Married    Spouse name: N/A  . Number of children: N/A  . Years of education: N/A   Social History Main Topics  . Smoking status: Never Smoker  . Smokeless tobacco: Never Used  . Alcohol use No  . Drug use: No  . Sexual activity: Not Asked   Other Topics Concern  . 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  Now 4 hours per day   X 5 fmla cause of mom  Stopped august 2014 cause of care.    Husband pipe ets.   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 amLODipine (NORVASC) 2.5 MG tablet TAKE 1 TABLET BY MOUTH EVERY DAY 90 tablet 0  . atorvastatin (LIPITOR) 10 MG tablet TAKE 1 TABLET BY  MOUTH EVERY DAY FOR cholesterol 90 tablet 1  . mirtazapine (REMERON) 7.5 MG tablet Take 1 tablet (7.5 mg total) by mouth at bedtime. 90 tablet 3  . sertraline (ZOLOFT) 50 MG tablet TAKE ONE AND A HALF TABLETS BY MOUTH EVERY DAY AS DIRECTED 135 tablet 0   No facility-administered medications prior to visit.      EXAM:  BP 122/82 (BP Location: Right Arm, Patient Position: Sitting, Cuff Size: Normal)   Pulse (!) 114   Temp 98 F (36.7 C) (Oral)   Wt 175 lb 9.6 oz (79.7 kg)   BMI 30.14 kg/m   Body mass index is 30.14 kg/m.  GENERAL: vitals reviewed and listed above, alert, oriented, appears well hydrated and in no acute distress HEENT: atraumatic, conjunctiva  clear, no obvious abnormalities on inspection of external nose and ears  NECK: no obvious masses on inspection palpation  LUNGS: clear to auscultation bilaterally, no wheezes, rales or rhonchi, good air movement Abdomen:  Sof,t normal bowel sounds without hepatosplenomegaly, no guarding rebound or masses no CVA tenderness  some r lbp  CV: HRRR, no clubbing cyanosis or  peripheral edema nl cap refill  MS: moves all extremities without noticeable focal  abnormality PSYCH: pleasant and cooperative, no obvious depression or anxiety UA  Small amounts 3+ leuk 2+ blood  2 + protein    ASSESSMENT AND PLAN:  Discussed the following assessment and plan:  Urinary tract infection with hematuria, site unspecified - Plan: Urine Culture, Urine Culture  Dysuria - Plan: POC Urinalysis Dipstick, Urine Culture, Urine Culture, CANCELED: POC Urinalysis - Glucose/Protein  Need for influenza vaccination - Plan: Flu vaccine HIGH DOSE PF (Fluzone High dose)    Expectant management.  -Patient advised to return or notify health care team  if symptoms worsen ,persist or new concerns arise.  Patient Instructions  Culturing her urine for bacteria causing a urinary tract infection. Begin antibiotic which is one a day for 3 days however I put a refill  on it in case her symptoms are not resolved in 3 days. You can consider taking an over-the-counter Pyridium or bladder anesthetic ask your pharmacy until your antibiotic kills the infection.   Follow-up or contact us if you're getting fever and full-blown chills or feeling worse.   Urinary Tract Infection, Adult A urinary tract infection (UTI) is an infection of any part of the urinary tract, which includes the kidneys, ureters, bladder, and urethra. These organs make, store, and get rid of urine in the body. UTI can be a bladder infection (cystitis) or kidney infection (pyelonephritis). What are the causes? This infection may be caused by fungi, viruses, or bacteria. Bacteria are the most common cause of UTIs. This condition can also be caused by repeated incomplete emptying of the bladder during urination. What increases the risk? This condition is more likely to develop if:  You ignore your need to urinate or hold urine for long periods of time.  You do not empty your bladder completely during urination.  You wipe back to front after urinating or having a bowel movement, if you are female.  You are uncircumcised, if you are female.  You are constipated.  You have a urinary catheter that stays in place (indwelling).  You have a weak defense (immune) system.  You have a medical condition that affects your bowels, kidneys, or bladder.  You have diabetes.  You take antibiotic medicines frequently or for long periods of time, and the antibiotics no longer work well against certain types of infections (antibiotic resistance).  You take medicines that irritate your urinary tract.  You are exposed to chemicals that irritate your urinary tract.  You are female.  What are the signs or symptoms? Symptoms of this condition include:  Fever.  Frequent urination or passing small amounts of urine frequently.  Needing to urinate urgently.  Pain or burning with urination.  Urine  that smells bad or unusual.  Cloudy urine.  Pain in the lower abdomen or back.  Trouble urinating.  Blood in the urine.  Vomiting or being less hungry than normal.  Diarrhea or abdominal pain.  Vaginal discharge, if you are female.  How is this diagnosed? This condition is diagnosed with a medical history and physical exam. You will also need to provide a urine sample to test your urine. Other tests may be done, including:  Blood tests.  Sexually transmitted disease (STD) testing.  If you have had more than one UTI, a cystoscopy or  imaging studies may be done to determine the cause of the infections. How is this treated? Treatment for this condition often includes a combination of two or more of the following:  Antibiotic medicine.  Other medicines to treat less common causes of UTI.  Over-the-counter medicines to treat pain.  Drinking enough water to stay hydrated.  Follow these instructions at home:  Take over-the-counter and prescription medicines only as told by your health care provider.  If you were prescribed an antibiotic, take it as told by your health care provider. Do not stop taking the antibiotic even if you start to feel better.  Avoid alcohol, caffeine, tea, and carbonated beverages. They can irritate your bladder.  Drink enough fluid to keep your urine clear or pale yellow.  Keep all follow-up visits as told by your health care provider. This is important.  Make sure to: ? Empty your bladder often and completely. Do not hold urine for long periods of time. ? Empty your bladder before and after sex. ? Wipe from front to back after a bowel movement if you are female. Use each tissue one time when you wipe. Contact a health care provider if:  You have back pain.  You have a fever.  You feel nauseous or vomit.  Your symptoms do not get better after 3 days.  Your symptoms go away and then return. Get help right away if:  You have severe back  pain or lower abdominal pain.  You are vomiting and cannot keep down any medicines or water. This information is not intended to replace advice given to you by your health care provider. Make sure you discuss any questions you have with your health care provider. Document Released: 09/06/2005 Document Revised: 05/10/2016 Document Reviewed: 10/18/2015 Elsevier Interactive Patient Education  2017 [employer].     [place]. Mariama Saintvil M.D.

## 2017-09-18 NOTE — Patient Instructions (Signed)
Culturing her urine for bacteria causing a urinary tract infection. Begin antibiotic which is one a day for 3 days however I put a refill on it in case her symptoms are not resolved in 3 days. You can consider taking an over-the-counter Pyridium or bladder anesthetic ask your pharmacy until your antibiotic kills the infection.   Follow-up or contact us if you're getting fever and full-blown chills or feeling worse.   Urinary Tract Infection, Adult A urinary tract infection (UTI) is an infection of any part of the urinary tract, which includes the kidneys, ureters, bladder, and urethra. These organs make, store, and get rid of urine in the body. UTI can be a bladder infection (cystitis) or kidney infection (pyelonephritis). What are the causes? This infection may be caused by fungi, viruses, or bacteria. Bacteria are the most common cause of UTIs. This condition can also be caused by repeated incomplete emptying of the bladder during urination. What increases the risk? This condition is more likely to develop if:  You ignore your need to urinate or hold urine for long periods of time.  You do not empty your bladder completely during urination.  You wipe back to front after urinating or having a bowel movement, if you are female.  You are uncircumcised, if you are female.  You are constipated.  You have a urinary catheter that stays in place (indwelling).  You have a weak defense (immune) system.  You have a medical condition that affects your bowels, kidneys, or bladder.  You have diabetes.  You take antibiotic medicines frequently or for long periods of time, and the antibiotics no longer work well against certain types of infections (antibiotic resistance).  You take medicines that irritate your urinary tract.  You are exposed to chemicals that irritate your urinary tract.  You are female.  What are the signs or symptoms? Symptoms of this condition  include:  Fever.  Frequent urination or passing small amounts of urine frequently.  Needing to urinate urgently.  Pain or burning with urination.  Urine that smells bad or unusual.  Cloudy urine.  Pain in the lower abdomen or back.  Trouble urinating.  Blood in the urine.  Vomiting or being less hungry than normal.  Diarrhea or abdominal pain.  Vaginal discharge, if you are female.  How is this diagnosed? This condition is diagnosed with a medical history and physical exam. You will also need to provide a urine sample to test your urine. Other tests may be done, including:  Blood tests.  Sexually transmitted disease (STD) testing.  If you have had more than one UTI, a cystoscopy or imaging studies may be done to determine the cause of the infections. How is this treated? Treatment for this condition often includes a combination of two or more of the following:  Antibiotic medicine.  Other medicines to treat less common causes of UTI.  Over-the-counter medicines to treat pain.  Drinking enough water to stay hydrated.  Follow these instructions at home:  Take over-the-counter and prescription medicines only as told by your health care provider.  If you were prescribed an antibiotic, take it as told by your health care provider. Do not stop taking the antibiotic even if you start to feel better.  Avoid alcohol, caffeine, tea, and carbonated beverages. They can irritate your bladder.  Drink enough fluid to keep your urine clear or pale yellow.  Keep all follow-up visits as told by your health care provider. This is important.  Make sure  to: ? Empty your bladder often and completely. Do not hold urine for long periods of time. ? Empty your bladder before and after sex. ? Wipe from front to back after a bowel movement if you are female. Use each tissue one time when you wipe. Contact a health care provider if:  You have back pain.  You have a fever.  You  feel nauseous or vomit.  Your symptoms do not get better after 3 days.  Your symptoms go away and then return. Get help right away if:  You have severe back pain or lower abdominal pain.  You are vomiting and cannot keep down any medicines or water. This information is not intended to replace advice given to you by your health care provider. Make sure you discuss any questions you have with your health care provider. Document Released: 09/06/2005 Document Revised: 05/10/2016 Document Reviewed: 10/18/2015 Elsevier Interactive Patient Education  2017 [employer].

## 2017-09-21 LAB — URINE CULTURE
MICRO NUMBER: 81123454
SPECIMEN QUALITY: ADEQUATE

## 2017-09-21 NOTE — Progress Notes (Signed)
Tell patient that urine culture shows e coli  sensitive to medication given . Should resolve with current treatment .FU if not better. 

## 2017-10-12 ENCOUNTER — Other Ambulatory Visit: Payer: Self-pay | Admitting: Internal Medicine

## 2017-12-19 ENCOUNTER — Other Ambulatory Visit: Payer: Self-pay | Admitting: Internal Medicine

## 2018-01-02 ENCOUNTER — Ambulatory Visit (INDEPENDENT_AMBULATORY_CARE_PROVIDER_SITE_OTHER): Payer: PPO

## 2018-01-02 VITALS — BP 130/90 | HR 71 | Ht 64.0 in | Wt 180.0 lb

## 2018-01-02 DIAGNOSIS — Z0000 Encounter for general adult medical examination without abnormal findings: Secondary | ICD-10-CM | POA: Diagnosis not present

## 2018-01-02 DIAGNOSIS — Z1211 Encounter for screening for malignant neoplasm of colon: Secondary | ICD-10-CM | POA: Diagnosis not present

## 2018-01-02 DIAGNOSIS — E2839 Other primary ovarian failure: Secondary | ICD-10-CM

## 2018-01-02 DIAGNOSIS — Z1231 Encounter for screening mammogram for malignant neoplasm of breast: Secondary | ICD-10-CM | POA: Diagnosis not present

## 2018-01-02 DIAGNOSIS — Z1159 Encounter for screening for other viral diseases: Secondary | ICD-10-CM

## 2018-01-02 DIAGNOSIS — Z1239 Encounter for other screening for malignant neoplasm of breast: Secondary | ICD-10-CM

## 2018-01-02 NOTE — Patient Instructions (Addendum)
Tiffany Lowe , Thank you for taking time to come for your Medicare Wellness Visit. I appreciate your ongoing commitment to your health goals. Please review the following plan we discussed and let me know if I can assist you in the future.   Ms. Tiffany Lowe will see Tiffany Lowe at 1pm on Jan 30 and Dr. Elease Lowe at 1:45   Will keep the bone density test on your radar.   You had a shingles 2014;  Shingrix is a vaccine for the prevention of Shingles in Adults 50 and older.  If you are on Medicare, you can request a prescription from your doctor to be filled at a pharmacy.  Please check with your benefits regarding applicable copays or out of pocket expenses.  The Shingrix is given in 2 vaccines approx 8 weeks apart. You must receive the 2nd dose prior to 6 months from receipt of the first.    Tiffany Lowe will order the colo - guard  Medicare Part B will cover the CologuardTM test once every three years for beneficiaries who meet all of the following criteria:  Age 94 to 61 years,  Asymptomatic (no signs or symptoms of colorectal disease including but not limited to lower gastrointestinal pain, blood in stool, positive guaiac fecal occult blood test or fecal immunochemical test), and  At average risk of developing colorectal cancer (no personal history of adenomatous polyps, colorectal cancer, or inflammatory bowel disease, including Crohn's Disease and ulcerative colitis; no family history of colorectal cancers or adenomatous polyps, familial adenomatous polyposis, or hereditary nonpolyposis colorectal cancer).   Keep a mammogram on your radar; generally mammogram are completed every year and it is best to have them up until your 72 yo or per Dr. Velora Lowe recommendations   May make an apt with dr. Regis Lowe over the next few months to draw and check labs.  [email or web address]  Educated regarding prediabetes and numbers;  A1c ranges from 5.8 to 6.5 or fasting Blood sugar >  115 -126; (126 is diabetic)  Your a1c is in normal range for now   Risk: >45yo; family hx; overweight or obese; African American; Hispanic; Latino; American [place] American; [place]; history of diabetes when pregnant; or birth to a baby weighing over 9 lbs. Being less physically active than 30 minutes; 3 times a week;   Prevention; Losing a modest 7 to 8 lbs; If over 200 lbs; 10 to 14 lbs;  Choose healthier foods; colorful veggies; fish or lean meats; drinks water Reduce portion size Start exercising; 30 minutes of fast walking x 30 minutes per day/ 60 min for weight loss    [employer]; [phone] Management consultant; Information regarding [place] Try some caregiver groups of people taking care of Alz, parkinson or those with delusions     [place] [facility]; [phone] [facility])  [place]; ([phone] Get resource to get information on any and all community programs for Seniors  Community solutions; "Aging Gracefully In Place" program; can request or apply  [employer]: 951-708-7625 Community Health Response Program -785-885-0277 Public Health Dept; Need to be a skilled visit but can assist with bathing as well; 501-624-0855  Adult center for Enrichment;  Call Senior Line; 720-321-8029  Adult day services include Adult Day Care, [place], Volunteer In [place], Education and Support Program  Help with Rx at [place]  Monday - Friday 8am to 10pm EST Sat- Sunday 9am to 7pm Patient help line ([phone])055-2665 Email support online at GeminiCard.gl  Dept of Social Services; Call [phone] and ask for SW on call  Options for Medicaid include the Community Alternatives program; [place]org (personal care services) or PACE program, which is a medical and social program combined  Braulio Conte manages the community Alternatives program at the [employer];  [phone] (this is a program with a waiting list but provides SNF care at home;  [facility] 2 required; Call Claiborne Billings and she will send out packet of information   Caregiver support group and information regarding [place] is at the; [facility] Address: [address] [place], [place] [address]  Phone: (762) [phone]   MobileCycles.pl general resources for food etc   Http://nihseniorhealth.giv  Deaf & Hard of Hearing Division Services - can assist with hearing aid x 1  No reviews  [place]  [address]  [phone]  Resource to find disability equipment (used) online; https://bradley.com/  Up walker for those that need more support!      These are the goals we discussed: Goals    . Patient Stated     Break the habit of eating a sweet after you eat Make your own chocolate covered items; using cocoa and sweetener Peanut butter on Celery Oranges        This is a list of the screening recommended for you and due dates:  Health Maintenance  Topic Date Due  . Cologuard (Stool DNA test)  09/20/1996  . Mammogram  01/02/2019*  . DEXA scan (bone density measurement)  01/02/2019*  .  Hepatitis C: One time screening is recommended by Center for Disease Control  (CDC) for  adults born from [birth year] through 1965.   01/02/2019*  . Tetanus Vaccine  09/30/2020  . Flu Shot  Completed  . Pneumonia vaccines  Completed  *Topic was postponed. The date shown is not the original due date.      Fall Prevention in the Home Falls can cause injuries. They can happen to people of all ages. There are many things you can do to make your home safe and to help prevent falls. What can I do on the outside of my home?  Regularly fix the edges of walkways and driveways and fix any cracks.  Remove anything that might make you trip as you walk through a door, such as a raised step or threshold.  Trim any  bushes or trees on the path to your home.  Use bright outdoor lighting.  Clear any walking paths of anything that might make someone trip, such as rocks or tools.  Regularly check to see if handrails are loose or broken. Make sure that both sides of any steps have handrails.  Any raised decks and porches should have guardrails on the edges.  Have any leaves, snow, or ice cleared regularly.  Use sand or salt on walking paths during winter.  Clean up any spills in your garage right away. This includes oil or grease spills. What can I do in the bathroom?  Use night lights.  Install grab bars by the toilet and in the tub and shower. Do not use towel bars as grab bars.  Use non-skid mats or decals in the tub or shower.  If you need to sit down in the shower, use a plastic, non-slip stool.  Keep the floor dry. Clean up any water that spills on the floor as soon as it happens.  Remove soap buildup in the tub or shower regularly.  Attach  bath mats securely with double-sided non-slip rug tape.  Do not have throw rugs and other things on the floor that can make you trip. What can I do in the bedroom?  Use night lights.  Make sure that you have a light by your bed that is easy to reach.  Do not use any sheets or blankets that are too big for your bed. They should not hang down onto the floor.  Have a firm chair that has side arms. You can use this for support while you get dressed.  Do not have throw rugs and other things on the floor that can make you trip. What can I do in the kitchen?  Clean up any spills right away.  Avoid walking on wet floors.  Keep items that you use a lot in easy-to-reach places.  If you need to reach something above you, use a strong step stool that has a grab bar.  Keep electrical cords out of the way.  Do not use floor polish or wax that makes floors slippery. If you must use wax, use non-skid floor wax.  Do not have throw rugs and other  things on the floor that can make you trip. What can I do with my stairs?  Do not leave any items on the stairs.  Make sure that there are handrails on both sides of the stairs and use them. Fix handrails that are broken or loose. Make sure that handrails are as long as the stairways.  Check any carpeting to make sure that it is firmly attached to the stairs. Fix any carpet that is loose or worn.  Avoid having throw rugs at the top or bottom of the stairs. If you do have throw rugs, attach them to the floor with carpet tape.  Make sure that you have a light switch at the top of the stairs and the bottom of the stairs. If you do not have them, ask someone to add them for you. What else can I do to help prevent falls?  Wear shoes that: ? Do not have high heels. ? Have rubber bottoms. ? Are comfortable and fit you well. ? Are closed at the toe. Do not wear sandals.  If you use a stepladder: ? Make sure that it is fully opened. Do not climb a closed stepladder. ? Make sure that both sides of the stepladder are locked into place. ? Ask someone to hold it for you, if possible.  Clearly mark and make sure that you can see: ? Any grab bars or handrails. ? First and last steps. ? Where the edge of each step is.  Use tools that help you move around (mobility aids) if they are needed. These include: ? Canes. ? Walkers. ? Scooters. ? Crutches.  Turn on the lights when you go into a dark area. Replace any light bulbs as soon as they burn out.  Set up your furniture so you have a clear path. Avoid moving your furniture around.  If any of your floors are uneven, fix them.  If there are any pets around you, be aware of where they are.  Review your medicines with your doctor. Some medicines can make you feel dizzy. This can increase your chance of falling. Ask your doctor what other things that you can do to help prevent falls. This information is not intended to replace advice given to  you by your health care provider. Make sure you discuss any questions you have with  your health care provider. Document Released: 09/23/2009 Document Revised: 05/04/2016 Document Reviewed: 01/01/2015 Elsevier Interactive Patient Education  2018 [place] Maintenance, Female Adopting a healthy lifestyle and getting preventive care can go a long way to promote health and wellness. Talk with your health care provider about what schedule of regular examinations is right for you. This is a good chance for you to check in with your provider about disease prevention and staying healthy. In between checkups, there are plenty of things you can do on your own. Experts have done a lot of research about which lifestyle changes and preventive measures are most likely to keep you healthy. Ask your health care provider for more information. Weight and diet Eat a healthy diet  Be sure to include plenty of vegetables, fruits, low-fat dairy products, and lean protein.  Do not eat a lot of foods high in solid fats, added sugars, or salt.  Get regular exercise. This is one of the most important things you can do for your health. ? Most adults should exercise for at least 150 minutes each week. The exercise should increase your heart rate and make you sweat (moderate-intensity exercise). ? Most adults should also do strengthening exercises at least twice a week. This is in addition to the moderate-intensity exercise.  Maintain a healthy weight  Body mass index (BMI) is a measurement that can be used to identify possible weight problems. It estimates body fat based on height and weight. Your health care provider can help determine your BMI and help you achieve or maintain a healthy weight.  For females 41 years of age and older: ? A BMI below 18.5 is considered underweight. ? A BMI of 18.5 to 24.9 is normal. ? A BMI of 25 to 29.9 is considered overweight. ? A BMI of 30 and above is considered  obese.  Watch levels of cholesterol and blood lipids  You should start having your blood tested for lipids and cholesterol at 72 years of age, then have this test every 5 years.  You may need to have your cholesterol levels checked more often if: ? Your lipid or cholesterol levels are high. ? You are older than 72 years of age. ? You are at high risk for heart disease.  Cancer screening Lung Cancer  Lung cancer screening is recommended for adults 41-51 years old who are at high risk for lung cancer because of a history of smoking.  A yearly low-dose CT scan of the lungs is recommended for people who: ? Currently smoke. ? Have quit within the past 15 years. ? Have at least a 30-pack-year history of smoking. A pack year is smoking an average of one pack of cigarettes a day for 1 year.  Yearly screening should continue until it has been 15 years since you quit.  Yearly screening should stop if you develop a health problem that would prevent you from having lung cancer treatment.  Breast Cancer  Practice breast self-awareness. This means understanding how your breasts normally appear and feel.  It also means doing regular breast self-exams. Let your health care provider know about any changes, no matter how small.  If you are in your 20s or 30s, you should have a clinical breast exam (CBE) by a health care provider every 1-3 years as part of a regular health exam.  If you are 60 or older, have a CBE every year. Also consider having a breast X-ray (mammogram) every year.  If you have  a family history of breast cancer, talk to your health care provider about genetic screening.  If you are at high risk for breast cancer, talk to your health care provider about having an MRI and a mammogram every year.  Breast cancer gene (BRCA) assessment is recommended for women who have family members with BRCA-related cancers. BRCA-related cancers  include: ? Breast. ? Ovarian. ? Tubal. ? Peritoneal cancers.  Results of the assessment will determine the need for genetic counseling and BRCA1 and BRCA2 testing.  Cervical Cancer Your health care provider may recommend that you be screened regularly for cancer of the pelvic organs (ovaries, uterus, and vagina). This screening involves a pelvic examination, including checking for microscopic changes to the surface of your cervix (Pap test). You may be encouraged to have this screening done every 3 years, beginning at age 50.  For women ages 21-65, health care providers may recommend pelvic exams and Pap testing every 3 years, or they may recommend the Pap and pelvic exam, combined with testing for human papilloma virus (HPV), every 5 years. Some types of HPV increase your risk of cervical cancer. Testing for HPV may also be done on women of any age with unclear Pap test results.  Other health care providers may not recommend any screening for nonpregnant women who are considered low risk for pelvic cancer and who do not have symptoms. Ask your health care provider if a screening pelvic exam is right for you.  If you have had past treatment for cervical cancer or a condition that could lead to cancer, you need Pap tests and screening for cancer for at least 20 years after your treatment. If Pap tests have been discontinued, your risk factors (such as having a new sexual partner) need to be reassessed to determine if screening should resume. Some women have medical problems that increase the chance of getting cervical cancer. In these cases, your health care provider may recommend more frequent screening and Pap tests.  Colorectal Cancer  This type of cancer can be detected and often prevented.  Routine colorectal cancer screening usually begins at 72 years of age and continues through 72 years of age.  Your health care provider may recommend screening at an earlier age if you have risk factors  for colon cancer.  Your health care provider may also recommend using home test kits to check for hidden blood in the stool.  A small camera at the end of a tube can be used to examine your colon directly (sigmoidoscopy or colonoscopy). This is done to check for the earliest forms of colorectal cancer.  Routine screening usually begins at age 19.  Direct examination of the colon should be repeated every 5-10 years through 72 years of age. However, you may need to be screened more often if early forms of precancerous polyps or small growths are found.  Skin Cancer  Check your skin from head to toe regularly.  Tell your health care provider about any new moles or changes in moles, especially if there is a change in a mole's shape or color.  Also tell your health care provider if you have a mole that is larger than the size of a pencil eraser.  Always use sunscreen. Apply sunscreen liberally and repeatedly throughout the day.  Protect yourself by wearing long sleeves, pants, a wide-brimmed hat, and sunglasses whenever you are outside.  Heart disease, diabetes, and high blood pressure  High blood pressure causes heart disease and increases the risk  of stroke. High blood pressure is more likely to develop in: ? People who have blood pressure in the high end of the normal range (130-139/85-89 mm Hg). ? People who are overweight or obese. ? People who are African American.  If you are 15-56 years of age, have your blood pressure checked every 3-5 years. If you are 19 years of age or older, have your blood pressure checked every year. You should have your blood pressure measured twice-once when you are at a hospital or clinic, and once when you are not at a hospital or clinic. Record the average of the two measurements. To check your blood pressure when you are not at a hospital or clinic, you can use: ? An automated blood pressure machine at a pharmacy. ? A home blood pressure monitor.  If  you are between 74 years and 57 years old, ask your health care provider if you should take aspirin to prevent strokes.  Have regular diabetes screenings. This involves taking a blood sample to check your fasting blood sugar level. ? If you are at a normal weight and have a low risk for diabetes, have this test once every three years after 72 years of age. ? If you are overweight and have a high risk for diabetes, consider being tested at a younger age or more often. Preventing infection Hepatitis B  If you have a higher risk for hepatitis B, you should be screened for this virus. You are considered at high risk for hepatitis B if: ? You were born in a country where hepatitis B is common. Ask your health care provider which countries are considered high risk. ? Your parents were born in a high-risk country, and you have not been immunized against hepatitis B (hepatitis B vaccine). ? You have HIV or AIDS. ? You use needles to inject street drugs. ? You live with someone who has hepatitis B. ? You have had sex with someone who has hepatitis B. ? You get hemodialysis treatment. ? You take certain medicines for conditions, including cancer, organ transplantation, and autoimmune conditions.  Hepatitis C  Blood testing is recommended for: ? Everyone born from [birth year] through 1965. ? Anyone with known risk factors for hepatitis C.  Sexually transmitted infections (STIs)  You should be screened for sexually transmitted infections (STIs) including gonorrhea and chlamydia if: ? You are sexually active and are younger than 72 years of age. ? You are older than 72 years of age and your health care provider tells you that you are at risk for this type of infection. ? Your sexual activity has changed since you were last screened and you are at an increased risk for chlamydia or gonorrhea. Ask your health care provider if you are at risk.  If you do not have HIV, but are at risk, it may be recommended  that you take a prescription medicine daily to prevent HIV infection. This is called pre-exposure prophylaxis (PrEP). You are considered at risk if: ? You are sexually active and do not regularly use condoms or know the HIV status of your partner(s). ? You take drugs by injection. ? You are sexually active with a partner who has HIV.  Talk with your health care provider about whether you are at high risk of being infected with HIV. If you choose to begin PrEP, you should first be tested for HIV. You should then be tested every 3 months for as long as you are taking PrEP. Pregnancy  If you are premenopausal and you may become pregnant, ask your health care provider about preconception counseling.  If you may become pregnant, take 400 to 800 micrograms (mcg) of folic acid every day.  If you want to prevent pregnancy, talk to your health care provider about birth control (contraception). Osteoporosis and menopause  Osteoporosis is a disease in which the bones lose minerals and strength with aging. This can result in serious bone fractures. Your risk for osteoporosis can be identified using a bone density scan.  If you are 32 years of age or older, or if you are at risk for osteoporosis and fractures, ask your health care provider if you should be screened.  Ask your health care provider whether you should take a calcium or vitamin D supplement to lower your risk for osteoporosis.  Menopause may have certain physical symptoms and risks.  Hormone replacement therapy may reduce some of these symptoms and risks. Talk to your health care provider about whether hormone replacement therapy is right for you. Follow these instructions at home:  Schedule regular health, dental, and eye exams.  Stay current with your immunizations.  Do not use any tobacco products including cigarettes, chewing tobacco, or electronic cigarettes.  If you are pregnant, do not drink alcohol.  If you are  breastfeeding, limit how much and how often you drink alcohol.  Limit alcohol intake to no more than 1 drink per day for nonpregnant women. One drink equals 12 ounces of beer, 5 ounces of wine, or 1 ounces of hard liquor.  Do not use street drugs.  Do not share needles.  Ask your health care provider for help if you need support or information about quitting drugs.  Tell your health care provider if you often feel depressed.  Tell your health care provider if you have ever been abused or do not feel safe at home. This information is not intended to replace advice given to you by your health care provider. Make sure you discuss any questions you have with your health care provider. Document Released: 06/12/2011 Document Revised: 05/04/2016 Document Reviewed: 08/31/2015 Elsevier Interactive Patient Education  [employer].

## 2018-01-02 NOTE — Progress Notes (Addendum)
Subjective:   Tiffany Lowe is a 72 y.o. female who presents for Medicare Annual (Subsequent) preventive examination.  Reports health as good Last OV 10/9 for UTI  Still lives with spouse Stressful at home Taking your of mother- she is 73  Delusional currently; starting to be every day occurrence. Brought information for Dr. Caryl Never to review prior to apt next week She is eating but thinks there are bugs in  Her food  Can't watch TV  Lost son x 2 yo suddenly; she is still grieving over this  She comes in her room every night and will start ranting.  Reinforced that she has brain problem, needs to be evaluated; Given information on counseling;  Also may find it helpful to read books written by caregivers    Spouse has bad diabetes but not eating well  Both are under stress  Diet Chol/hdl 3; hdl 71; LDL 130  Glucose 5.7 from 6.0  BMI 30.9 Breakfast;pop tarts Lunch; cottage cheese; Tourist information centre manager and yogurt Gained weight but some if this is stress eating    Exercise; to busy taking full time care of mother.   No EToh No Tobacco use     Health Maintenance Due  Topic Date Due  . Fecal DNA (Cologuard)  09/20/1996   Colonoscopy due 07/2006 - declines but agrees to the cologuard  Will order and fax over cologuard  Bone density scan  Cardiac Risk Factors include: advanced age (>44men, >31 women);dyslipidemia;family history of premature cardiovascular disease;hypertensionmammogram 10/2012  Qualifies for Shingles Vaccin   Given information and may consider later; Zoster in 06/2013  Objective:     Vitals: BP 130/90   Pulse 71   Ht 5\' 4"  (1.626 m)   Wt 180 lb (81.6 kg)   SpO2 98%   BMI 30.90 kg/m   Body mass index is 30.9 kg/m.  Advanced Directives 01/02/2018 09/15/2013  Does Patient Have a Medical Advance Directive? No Patient does not have advance directive  Pre-existing out of facility DNR order (yellow form or pink MOST form) - No   Will pass  today; One son and dtr in law  Tobacco Social History   Tobacco Use  Smoking Status Never Smoker  Smokeless Tobacco Never Used     Counseling given: Yes   Clinical Intake:  Past Medical History:  Diagnosis Date  . 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 Chronic back pain   . Depression    mood reactive  . H/O: rheumatic fever    maybe  . Hyperlipidemia   . Right leg pain   . 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 cataract surgery     [place]  . COLONOSCOPY  13-May-2006  . TUBAL LIGATION     Family History  Problem Relation Age of Onset  . Alzheimer's disease Father        died 13-May-2005  . Diverticulitis Mother        with colostomy.   . Coronary artery disease Other   . Hyperlipidemia Other   . Stroke Other    Social History   Socioeconomic History  . Marital status: Married    Spouse name: Not on file  . Number of children: Not on file  . Years of education: Not on file  . Highest education level: Not on file  Social Needs  . Financial resource strain: Not on file  . Food insecurity - worry: Not on  file  . Food insecurity - inability: Not on file  . Transportation needs - medical: Not on file  . Transportation needs - non-medical: Not on file  Occupational History  . Not on file  Tobacco Use  . Smoking status: Never Smoker  . Smokeless tobacco: Never Used  Substance and Sexual Activity  . Alcohol use: No  . Drug use: No  . Sexual activity: Not on file  Other Topics Concern  . 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  Now 4 hours per day   X 5 fmla cause of mom  Stopped august 2014 cause of care.    Husband pipe ets.   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 amLODipine (NORVASC) 2.5 MG tablet TAKE 1 TABLET BY MOUTH  EVERY DAY  . atorvastatin (LIPITOR) 10 MG tablet TAKE 1 TABLET BY MOUTH EVERY DAY FOR CHOLESTEROL  . mirtazapine (REMERON) 15 MG tablet TAKE 1/2 TABLET BY MOUTH AT BEDTIME  . sertraline (ZOLOFT) 50 MG tablet TAKE ONE AND A HALF TABLETS BY MOUTH EVERY DAY AS DIRECTED  . [DISCONTINUED] sulfamethoxazole-trimethoprim (BACTRIM DS,SEPTRA DS) 800-160 MG tablet Take 1 tablet by mouth 2 (two) times daily. (Patient not taking: Reported on 01/02/2018)   No facility-administered encounter medications on file as of 01/02/2018.     Activities of Daily Living In your present state of health, do you have any difficulty performing the following activities: 01/02/2018  Hearing? N  Vision? N  Difficulty concentrating or making decisions? Y  Walking or climbing stairs? N  Dressing or bathing? N  Doing errands, shopping? N  Preparing Food and eating ? N  Using the Toilet? N  In the past six months, have you accidently leaked urine? N  Comment recent kidney infection  Do you have problems with loss of bowel control? N  Managing your Medications? N  Managing your Finances? N  Housekeeping or managing your Housekeeping? N  Some recent data might be hidden    Patient Care Team: Panosh, Neta MendsWanda K, MD as PCP - Clarisa KindredGeneral Gioffre, Ronald, MD as Attending Physician (Orthopedic Surgery)    Assessment:   This is a routine wellness examination for Tiffany Lowe.  Exercise Activities and Dietary recommendations Current Exercise Habits: Home exercise routine(full time caregiver )  Goals    . Patient Stated     Break the habit of eating a sweet after you eat Make your own chocolate covered items; using cocoa and sweetener Peanut butter on Celery Oranges        Fall Risk Fall Risk  01/02/2018 05/19/2014 05/19/2014  Falls in the past year? No No No   No falls  Depression Screen PHQ 2/9 Scores 01/02/2018 07/12/2016 05/19/2014 05/19/2014  PHQ - 2 Score 0 4 1 0   Still grieving; Misses son who died spontaneously x 2 yo    Cognitive Function Ad8 score reviewed for issues:  Issues making decisions:  Less interest in hobbies / activities:  Repeats questions, stories (family complaining):  Trouble using ordinary gadgets (microwave, computer, phone):  Forgets the month or year:   Mismanaging finances:   Remembering appts:  Daily problems with thinking and/or memory: Ad8 score is=0    MMSE - Mini Mental State Exam 01/02/2018  Not completed: (No Data)        Immunization History  Administered Date(s) Administered  . Influenza Split 11/09/2011, 09/25/2012  . Influenza Whole 09/30/2010  . Influenza, High Dose Seasonal PF 10/12/2015, 09/18/2017  . Influenza,inj,Quad PF,6+ Mos 10/07/2013, 09/15/2014, 08/03/2016  . Pneumococcal Conjugate-13 05/19/2014  . Pneumococcal Polysaccharide-23 01/24/2012  . Td 12/11/1998, 09/30/2010  . Zoster 06/27/2013      Screening Tests Health Maintenance  Topic Date Due  . Fecal DNA (Cologuard)  09/20/1996  . MAMMOGRAM  01/02/2019 (Originally 10/24/2014)  . DEXA SCAN  01/02/2019 (Originally 09/21/2011)  . Hepatitis C Screening  01/02/2019 (Originally December 13, 1945)  . TETANUS/TDAP  09/30/2020  . INFLUENZA VACCINE  Completed  . PNA vac Low Risk Adult  Completed         Plan:      PCP Notes   Health Maintenance To schedule mammogram and bone density; ordered so she will get a call to schedule later this year.  Educated regarding shingrix  Declines coloscopy but agrees to the colo- guard  Family hx reported as neg - last exam neg for polyps    Abnormal Screens  A1c elevated in past, discussed eating habits and change   Referrals  Will make apt with Dr. Fabian Sharp over the next few months to draw labs  Patient concerns; Mother continues to be delusional; given resources and made apt with her doctor for fup Still grieving over son who died x 2 yo. Recommended counseling as needed but very resistant  Encouraged to read books written by  caregivers for insight and future planning or attend caregivers groups  Mother in home continues to be delusional; states it is getting worse but does not feel threatened by her  Mother and mother has not threatened to harm herself.  Notes regarding mother's behavioral passed on to Dr. Caryl Never as her PCP and apt for fup made with Dr. Caryl Never     Nurse Concerns; As noted   Next PCP apt TBS soon per the patient      I have personally reviewed and noted the following in the patient's chart:   . Medical and social history . Use of alcohol, tobacco or illicit drugs  . Current medications and supplements . Functional ability and status . Nutritional status . Physical activity . Advanced directives . List of other physicians . Hospitalizations, surgeries, and ER visits in previous 12 months . Vitals . Screenings to include cognitive, depression, and falls . Referrals and appointments  In addition, I have reviewed and discussed with patient certain preventive protocols, quality metrics, and best practice recommendations. A written personalized care plan for preventive services as well as general preventive health recommendations were provided to patient.     Hanzel Pizzo, RN  01/02/2018  Above noted reviewed and agree. Berniece Andreas, MD

## 2018-01-14 ENCOUNTER — Other Ambulatory Visit: Payer: Self-pay | Admitting: Internal Medicine

## 2018-01-15 DIAGNOSIS — Z1212 Encounter for screening for malignant neoplasm of rectum: Secondary | ICD-10-CM | POA: Diagnosis not present

## 2018-01-15 DIAGNOSIS — Z1211 Encounter for screening for malignant neoplasm of colon: Secondary | ICD-10-CM | POA: Diagnosis not present

## 2018-01-15 LAB — COLOGUARD: COLOGUARD: NEGATIVE

## 2018-01-16 NOTE — Telephone Encounter (Signed)
Medication sent in electronically.  

## 2018-01-31 ENCOUNTER — Encounter: Payer: Self-pay | Admitting: Internal Medicine

## 2018-02-19 ENCOUNTER — Other Ambulatory Visit: Payer: Self-pay | Admitting: *Deleted

## 2018-02-19 MED ORDER — AMLODIPINE BESYLATE 2.5 MG PO TABS: 2.5000 mg | ORAL_TABLET | Freq: Every day | ORAL | 0 refills | Status: DC

## 2018-02-20 ENCOUNTER — Telehealth: Payer: Self-pay | Admitting: Internal Medicine

## 2018-02-20 ENCOUNTER — Other Ambulatory Visit: Payer: Self-pay | Admitting: Internal Medicine

## 2018-02-20 MED ORDER — AMLODIPINE BESYLATE 2.5 MG PO TABS: 2.5000 mg | ORAL_TABLET | Freq: Every day | ORAL | 0 refills | Status: DC

## 2018-02-20 NOTE — Telephone Encounter (Signed)
[facility] and resent to [facility].

## 2018-02-20 NOTE — Telephone Encounter (Signed)
Copied from CRM [id number]. Topic: General - Other >> Feb 20, 2018 11:40 AM Cecelia ByarsGreen, Temeka L, RMA wrote: Reason for CRM: patient's medication amLODipine (NORVASC) 2.5 MG was sent to the wrong [facility], [facility] [address], please resend medication to [facility] per patient/

## 2018-05-01 ENCOUNTER — Telehealth: Payer: Self-pay | Admitting: Internal Medicine

## 2018-05-01 NOTE — Telephone Encounter (Signed)
Copied from CRM [id number]. Topic: Quick Communication - Rx Refill/Question >> May 01, 2018 10:29 AM Herby Abraham C wrote: Medication: sertraline (ZOLOFT) 50 MG tablet, amLODipine (NORVASC) 2.5 MG tablet   Has the patient contacted their pharmacy? Yes   (Agent: If no, request that the patient contact the pharmacy for the refill.) (Agent: If yes, when and what did the pharmacy advise?)  Preferred Pharmacy (with phone number or street name): [facility] [place] - [place], [place] - [address] [phone] (Phone) [phone] (Fax)      Agent: Please be advised that RX refills may take up to 3 business days. We ask that you follow-up with your pharmacy.

## 2018-05-01 NOTE — Telephone Encounter (Signed)
Left message for pt to return call to the office to schedule office visit in order to continue to get refills on the requested medications.

## 2018-05-02 NOTE — Progress Notes (Signed)
Chief Complaint  Patient presents with  . Medication Refill    Sertraline and Amlodipine. Pt reports good tolerance on both, no current issues.  . Medication Management  . Hyperlipidemia  . Hypertension    HPI: Kamerin Axford 72 y.o. comes in today foryearly   Chronic disease management hard to make visit  Cause not caretalking both husband and mom   Very little time to get away  Sertraline seems to help a bit    ? goping up on dose  Using remeron at nithg  BP med taking  Needs refill  LIPID med taking   Got colon  Still needs to get mammo  No falling   Health Maintenance  Topic Date Due  . MAMMOGRAM  01/02/2019 (Originally 10/24/2014)  . DEXA SCAN  01/02/2019 (Originally 09/21/2011)  . Hepatitis C Screening  01/02/2019 (Originally Jun 19, 1946)  . INFLUENZA VACCINE  07/11/2018  . TETANUS/TDAP  09/30/2020  . Fecal DNA (Cologuard)  01/15/2021  . PNA vac Low Risk Adult  Completed   hh o 3  Pet cat      ROS:  GEN/ HEENT: No fever, significant weight changes sweats headaches vision problems hearing changes, CV/ PULM; No chest pain shortness of breath cough, syncope,edema  change in exercise tolerance. GI /GU: No adominal pain, vomiting, change in bowel habits. No blood in the stool. No significant GU symptoms. SKIN/HEME: ,no acute skin rashes suspicious lesions or bleeding. No lymphadenopathy, nodules, masses.  NEURO/ PSYCH:  No neurologic signs such as weakness numbness.  IMM/ Allergy: No unusual infections.  Allergy .   REST of 12 system review negative except as per HPI   Past Medical History:  Diagnosis Date  . 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 Chronic back pain   . Depression    mood reactive  . H/O: rheumatic fever    maybe  . Hyperlipidemia   . Right leg pain   . 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 Alzheimer's disease Father        died 04-21-2005  . Diverticulitis Mother        with  colostomy.   . Coronary artery disease Other   . Hyperlipidemia Other   . Stroke Other     Social History   Socioeconomic History  . Marital status: Married    Spouse name: Not on file  . Number of children: Not on file  . Years of education: Not on file  . Highest education level: Not on file  Occupational History  . Not on file  Social Needs  . Financial resource strain: Not on file  . Food insecurity:    Worry: Not on file    Inability: Not on file  . Transportation needs:    Medical: Not on file    Non-medical: Not on file  Tobacco Use  . Smoking status: Never Smoker  . Smokeless tobacco: Never Used  Substance and Sexual Activity  . Alcohol use: No  . Drug use: No  . Sexual activity: Not on file  Lifestyle  . Physical activity:    Days per week: Not on file    Minutes per session: Not on file  . Stress: Not on file  Relationships  . Social connections:    Talks on phone: Not on file    Gets together: Not on file    Attends religious service: Not on file  Active member of club or organization: Not on file    Attends meetings of clubs or organizations: Not on file    Relationship status: Not on file  Other Topics Concern  . 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    Husband pipe ets.   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 amLODipine (NORVASC) 2.5 MG tablet Take 1 tablet (2.5 mg total) by mouth daily.  Marland Kitchen atorvastatin (LIPITOR) 10 MG tablet TAKE 1 TABLET BY MOUTH EVERY DAY FOR CHOLESTEROL  . mirtazapine (REMERON) 15 MG tablet TAKE 1/2 TABLET BY MOUTH AT BEDTIME  . [DISCONTINUED] amLODipine (NORVASC) 2.5 MG tablet Take 1 tablet (2.5 mg total) by mouth daily. NEEDS OFFICE VISIT FOR FURTHER REFILLS.  .  [DISCONTINUED] sertraline (ZOLOFT) 50 MG tablet TAKE ONE AND A HALF TABLETS BY MOUTH EVERY DAY AS DIRECTED  . sertraline (ZOLOFT) 100 MG tablet Take 1 tablet (100 mg total) by mouth daily.   No facility-administered encounter medications on file as of 05/03/2018.     EXAM:  BP 136/88 (BP Location: Left Arm, Patient Position: Sitting, Cuff Size: Normal)   Pulse 93   Temp 97.8 F (36.6 C) (Oral)   Wt 180 lb (81.6 kg)   BMI 30.90 kg/m   Body mass index is 30.9 kg/m.  Physical Exam: Vital signs reviewed ZOX:WRUE is a well-developed well-nourished alert cooperative   who appears stated age in no acute distress.  HEENT: normocephalic atraumatic , Eyes: PERRL EOM's full, conjunctiva clear, Nares: paten,t no deformity discharge or tenderness., Ears: no deformity EAC's clear TMs with normal landmarks. Mouth: clear OP, no lesions, edema.  Moist mucous membranes. Dentition in adequate repair. NECK: supple without masses, thyromegaly or bruits. CHEST/PULM:  Clear to auscultation and percussion breath sounds equal no wheeze , rales or rhonchi. . CV: PMI is nondisplaced, S1 S2 no gallops, murmurs, rubs. Peripheral pulses are full without delay.No JVD .  ABDOMEN: Bowel sounds normal nontender  No guard or rebound, no hepato splenomegal no CVA tenderness.   Extremtities:  No clubbing cyanosis or edema, no acute joint swelling or redness no focal atrophy NEURO:  Oriented x3, cranial nerves 3-12 appear to be intact, no obvious focal weakness,gait within normal limits SKIN: No acute rashes normal turgor, color, no bruising or petechiae. PSYCH: Oriented, good eye contact, no obvious depression anxiety, somewhat stressed but appropriate   cognition and judgment appear normal. LN: no cervical al adenopathy No noted deficits in memory, attention, and speech.   Lab Results  Component Value Date   WBC 5.0 07/12/2016   HGB 14.4 07/12/2016   HCT 42.0 07/12/2016   PLT 274.0 07/12/2016   GLUCOSE 99  07/12/2016   CHOL 233 (H) 12/20/2016   TRIG 119.0 12/20/2016   HDL 71.40 12/20/2016   LDLDIRECT 190.0 09/30/2010   LDLCALC 138 (H) 12/20/2016   ALT 16 07/12/2016   AST 13 07/12/2016   NA 141 07/12/2016   K 4.7 07/12/2016   CL 105 07/12/2016   CREATININE 0.74 07/12/2016   BUN 11 07/12/2016   CO2 28 07/12/2016   TSH 0.57 07/12/2016  INR 3.6 09/15/2014   HGBA1C 5.7 07/12/2016    ASSESSMENT AND PLAN:  Discussed the following assessment and plan:  Essential hypertension - Plan: Basic metabolic panel, CBC with Differential/Platelet, Hepatic function panel, Lipid panel  Medication management - Plan: Basic metabolic panel, CBC with Differential/Platelet, Hepatic function panel, Lipid panel  Hyperlipidemia, unspecified hyperlipidemia type - Plan: Basic metabolic panel, CBC with Differential/Platelet, Hepatic function panel, Lipid panel  Adjustment disorder with depressed mood - Plan: Basic metabolic panel, CBC with Differential/Platelet, Hepatic function panel, Lipid panel  Caregiver stress  Sleep disturbance Overdue for monitoring  Is caretaker and handling stress as best possible  bereaved parent  2.5 years   Difficult for her to take time for ov  So disc my chart sing up with family help.  Inc  Sertraline to 100 mg per day although she appears to be doing well at this time  Patient Care Team: Reinette Cuneo, Neta Mends, MD as PCP - Clarisa Kindred, MD as Attending Physician (Orthopedic Surgery)  Patient Instructions  Increase    100 mg sertraline.    Take one a day  Stay on same bp medications   And cholesterol medication.  don't forget to get your mammogram .   Will notify you  of labs when available.     Neta Mends. Janari Yamada M.D.

## 2018-05-03 ENCOUNTER — Ambulatory Visit (INDEPENDENT_AMBULATORY_CARE_PROVIDER_SITE_OTHER): Payer: PPO | Admitting: Internal Medicine

## 2018-05-03 ENCOUNTER — Encounter: Payer: Self-pay | Admitting: Internal Medicine

## 2018-05-03 VITALS — BP 136/88 | HR 93 | Temp 97.8°F | Wt 180.0 lb

## 2018-05-03 DIAGNOSIS — I10 Essential (primary) hypertension: Secondary | ICD-10-CM | POA: Diagnosis not present

## 2018-05-03 DIAGNOSIS — Z636 Dependent relative needing care at home: Secondary | ICD-10-CM | POA: Diagnosis not present

## 2018-05-03 DIAGNOSIS — G479 Sleep disorder, unspecified: Secondary | ICD-10-CM | POA: Diagnosis not present

## 2018-05-03 DIAGNOSIS — Z79899 Other long term (current) drug therapy: Secondary | ICD-10-CM

## 2018-05-03 DIAGNOSIS — F4321 Adjustment disorder with depressed mood: Secondary | ICD-10-CM | POA: Diagnosis not present

## 2018-05-03 DIAGNOSIS — E785 Hyperlipidemia, unspecified: Secondary | ICD-10-CM

## 2018-05-03 LAB — BASIC METABOLIC PANEL
BUN: 12 mg/dL (ref 6–23)
CHLORIDE: 104 meq/L (ref 96–112)
CO2: 30 meq/L (ref 19–32)
Calcium: 9.9 mg/dL (ref 8.4–10.5)
Creatinine, Ser: 0.83 mg/dL (ref 0.40–1.20)
GFR: 71.9 mL/min (ref >60.00)
GLUCOSE: 84 mg/dL (ref 70–99)
Potassium: 4.4 mEq/L (ref 3.5–5.1)
SODIUM: 141 meq/L (ref 135–145)

## 2018-05-03 LAB — CBC WITH DIFFERENTIAL/PLATELET
BASOS PCT: 0.8 % (ref 0.0–3.0)
Basophils Absolute: 0 10*3/uL (ref 0.0–0.1)
EOS PCT: 1.9 % (ref 0.0–5.0)
Eosinophils Absolute: 0.1 10*3/uL (ref 0.0–0.7)
HEMATOCRIT: 40.3 % (ref 36.0–46.0)
Hemoglobin: 13.9 g/dL (ref 12.0–15.0)
Lymphocytes Relative: 42 % (ref 12.0–46.0)
Lymphs Abs: 2.2 10*3/uL (ref 0.7–4.0)
MCHC: 34.5 g/dL (ref 30.0–36.0)
MCV: 86.9 fl (ref 78.0–100.0)
MONOS PCT: 9.7 % (ref 3.0–12.0)
Monocytes Absolute: 0.5 10*3/uL (ref 0.1–1.0)
NEUTROS ABS: 2.4 10*3/uL (ref 1.4–7.7)
Neutrophils Relative %: 45.6 % (ref 43.0–77.0)
PLATELETS: 306 10*3/uL (ref 150.0–400.0)
RBC: 4.64 Mil/uL (ref 3.87–5.11)
RDW: 13.6 % (ref 11.5–15.5)
WBC: 5.3 10*3/uL (ref 4.0–10.5)

## 2018-05-03 LAB — LDL CHOLESTEROL, DIRECT: LDL DIRECT: 141 mg/dL

## 2018-05-03 LAB — HEPATIC FUNCTION PANEL
ALBUMIN: 4.4 g/dL (ref 3.5–5.2)
ALK PHOS: 89 U/L (ref 39–117)
ALT: 16 U/L (ref 0–35)
AST: 14 U/L (ref 0–37)
Bilirubin, Direct: 0.1 mg/dL (ref 0.0–0.3)
TOTAL PROTEIN: 7 g/dL (ref 6.0–8.3)
Total Bilirubin: 0.5 mg/dL (ref 0.2–1.2)

## 2018-05-03 LAB — LIPID PANEL
CHOLESTEROL: 234 mg/dL — AB (ref 0–200)
HDL: 60.1 mg/dL (ref >39.00)
NonHDL: 173.95
TRIGLYCERIDES: 221 mg/dL — AB (ref 0.0–149.0)
Total CHOL/HDL Ratio: 4
VLDL: 44.2 mg/dL — ABNORMAL HIGH (ref 0.0–40.0)

## 2018-05-03 MED ORDER — SERTRALINE HCL 100 MG PO TABS: 100.0000 mg | ORAL_TABLET | Freq: Every day | ORAL | 1 refills | Status: DC

## 2018-05-03 MED ORDER — AMLODIPINE BESYLATE 2.5 MG PO TABS: 2.5000 mg | ORAL_TABLET | Freq: Every day | ORAL | 3 refills | Status: DC

## 2018-05-03 NOTE — Patient Instructions (Addendum)
Increase    100 mg sertraline.    Take one a day  Stay on same bp medications   And cholesterol medication.  don't forget to get your mammogram .   Will notify you  of labs when available.

## 2018-05-15 ENCOUNTER — Other Ambulatory Visit: Payer: Self-pay | Admitting: Internal Medicine

## 2018-05-15 DIAGNOSIS — E785 Hyperlipidemia, unspecified: Secondary | ICD-10-CM

## 2018-05-15 MED ORDER — ATORVASTATIN CALCIUM 20 MG PO TABS: 20.0000 mg | ORAL_TABLET | Freq: Every day | ORAL | 1 refills | Status: DC

## 2018-07-05 ENCOUNTER — Telehealth: Payer: Self-pay | Admitting: Internal Medicine

## 2018-07-05 MED ORDER — MIRTAZAPINE 15 MG PO TABS: 7.5000 mg | ORAL_TABLET | Freq: Every day | ORAL | 0 refills | Status: DC

## 2018-07-05 NOTE — Telephone Encounter (Signed)
Copied from CRM [id number]. Topic: Quick Communication - Rx Refill/Question >> Jul 05, 2018 10:44 AM Gaynelle AduPoole, Shalonda wrote: Medication: mirtazapine (REMERON) 15 MG tablet     Has the patient contacted their pharmacy? no Preferred Pharmacy (with phone number or street name): [facility] [place], [place] - [id number] [phone] (Phone) [phone] (Fax)      Agent: Please be advised that RX refills may take up to 3 business days. We ask that you follow-up with your pharmacy.

## 2018-07-05 NOTE — Telephone Encounter (Signed)
Refill approved x 90 days.  Last OV 04/2018 with Dr Fabian SharpPanosh  Nothing further needed.

## 2018-08-23 ENCOUNTER — Other Ambulatory Visit: Payer: Self-pay | Admitting: Internal Medicine

## 2018-10-07 ENCOUNTER — Other Ambulatory Visit: Payer: Self-pay | Admitting: Internal Medicine

## 2018-11-14 ENCOUNTER — Ambulatory Visit: Payer: PPO

## 2018-11-15 ENCOUNTER — Ambulatory Visit (INDEPENDENT_AMBULATORY_CARE_PROVIDER_SITE_OTHER): Payer: PPO

## 2018-11-15 DIAGNOSIS — Z23 Encounter for immunization: Secondary | ICD-10-CM

## 2019-01-03 ENCOUNTER — Ambulatory Visit: Payer: PPO

## 2019-02-17 ENCOUNTER — Other Ambulatory Visit: Payer: Self-pay | Admitting: Internal Medicine

## 2019-05-08 ENCOUNTER — Other Ambulatory Visit: Payer: Self-pay | Admitting: Internal Medicine

## 2019-05-08 NOTE — Telephone Encounter (Signed)
Pt is doing telephone visit tomorrow

## 2019-05-09 ENCOUNTER — Other Ambulatory Visit: Payer: Self-pay

## 2019-05-09 ENCOUNTER — Encounter: Payer: Self-pay | Admitting: Internal Medicine

## 2019-05-09 ENCOUNTER — Ambulatory Visit (INDEPENDENT_AMBULATORY_CARE_PROVIDER_SITE_OTHER): Payer: PPO | Admitting: Internal Medicine

## 2019-05-09 DIAGNOSIS — I10 Essential (primary) hypertension: Secondary | ICD-10-CM | POA: Diagnosis not present

## 2019-05-09 DIAGNOSIS — Z79899 Other long term (current) drug therapy: Secondary | ICD-10-CM

## 2019-05-09 DIAGNOSIS — E785 Hyperlipidemia, unspecified: Secondary | ICD-10-CM | POA: Diagnosis not present

## 2019-05-09 DIAGNOSIS — F4321 Adjustment disorder with depressed mood: Secondary | ICD-10-CM | POA: Diagnosis not present

## 2019-05-09 DIAGNOSIS — G479 Sleep disorder, unspecified: Secondary | ICD-10-CM | POA: Diagnosis not present

## 2019-05-09 DIAGNOSIS — Z634 Disappearance and death of family member: Secondary | ICD-10-CM

## 2019-05-09 MED ORDER — SERTRALINE HCL 100 MG PO TABS
100.0000 mg | ORAL_TABLET | Freq: Every day | ORAL | 1 refills | Status: DC
Start: 2019-05-09 — End: 2019-10-17

## 2019-05-09 MED ORDER — MIRTAZAPINE 15 MG PO TABS: 7.5000 mg | ORAL_TABLET | Freq: Every day | ORAL | 1 refills | Status: DC

## 2019-05-09 NOTE — Progress Notes (Signed)
Virtual Visit via Video Note  I connected with@ on 05/09/19 at  3:30 PM EDT by a video enabled telemedicine application and verified that I am speaking with the correct person using two identifiers. Location patient: home Location provider: home office Persons participating in the virtual visit: patient, provider  WIth national recommendations  regarding COVID 19 pandemic   video visit is advised over in office visit for this patient.  Patient aware  of the limitations of evaluation and management by telemedicine and  availability of in person appointments. and agreed to proceed.   HPI: Tiffany Lowe presents for video visit  Due for yearly visit  And med check  Since last visit  Her husband passed in December for 6 mos declining illness   Still care taking   73 YO mom    Restarted remeron 7.5  To help with sleep and needs refill  Feels grieving but  Not stuck in depression at this time.  BP in range 130/80  No se of med amlodipine   Mood sertraline 100 mg  Will need refill    HLD  No se of med  And  Has no more refills    ROS: See pertinent positives and negatives per HPI. No cp sob syncope  Keeping her mom isolated     Past Medical History:  Diagnosis Date  . 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 Chronic back pain   . Depression    mood reactive  . H/O: rheumatic fever    maybe  . Hyperlipidemia   . Right leg pain   . 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 cataract surgery     [facility]  . COLONOSCOPY  2007  . TUBAL LIGATION      Family History  Problem Relation Age of Onset  . Alzheimer's disease Father        died 2006  . Diverticulitis Mother        with colostomy.   . Coronary artery disease Other   . Hyperlipidemia Other   . Stroke Other     Social History   Tobacco Use  . Smoking status: Never Smoker  . Smokeless tobacco: Never Used  Substance Use Topics  . Alcohol use: No  .  Drug use: No      Current Outpatient Medications:  .  amLODipine (NORVASC) 2.5 MG tablet, TAKE 1 TABLET BY MOUTH EVERY DAY, Disp: 90 tablet, Rfl: 3 .  atorvastatin (LIPITOR) 20 MG tablet, TAKE 1 TABLET BY MOUTH EVERY DAY, Disp: 90 tablet, Rfl: 1 .  mirtazapine (REMERON) 15 MG tablet, Take 0.5 tablets (7.5 mg total) by mouth at bedtime., Disp: 45 tablet, Rfl: 1 .  sertraline (ZOLOFT) 100 MG tablet, Take 1 tablet (100 mg total) by mouth daily., Disp: 90 tablet, Rfl: 1  EXAM: BP Readings from Last 3 Encounters:  05/03/18 136/88  01/02/18 130/90  09/18/17 122/82    VITALS per patient if applicable: reports bp 130/80  And weight about 178  GENERAL: alert, oriented, appears well and in no acute distress  HEENT: atraumatic, conjunttiva clear, no obvious abnormalities on inspection of external nose and ears  NECK: normal movements of the head and neck  LUNGS: on inspection no signs of respiratory distress, breathing rate appears normal, no obvious gross SOB, gasping or wheezing  CV: no obvious cyanosis  MS: moves all visible extremities without noticeable abnormality  PSYCH/NEURO: pleasant and  cooperative, no obvious depression or anxiety, speech and thought processing grossly intact Lab Results  Component Value Date   WBC 5.3 05/03/2018   HGB 13.9 05/03/2018   HCT 40.3 05/03/2018   PLT 306.0 05/03/2018   GLUCOSE 84 05/03/2018   CHOL 234 (H) 05/03/2018   TRIG 221.0 (H) 05/03/2018   HDL 60.10 05/03/2018   LDLDIRECT 141.0 05/03/2018   LDLCALC 138 (H) 12/20/2016   ALT 16 05/03/2018   AST 14 05/03/2018   NA 141 05/03/2018   K 4.4 05/03/2018   CL 104 05/03/2018   CREATININE 0.83 05/03/2018   BUN 12 05/03/2018   CO2 30 05/03/2018   TSH 0.57 07/12/2016   INR 3.6 09/15/2014   HGBA1C 5.7 07/12/2016    ASSESSMENT AND PLAN:  Discussed the following assessment and plan:  Essential hypertension - Plan: Basic metabolic panel, CBC with Differential/Platelet, Hepatic function  panel, Lipid panel  Medication management - Plan: Basic metabolic panel, CBC with Differential/Platelet, Hepatic function panel, Lipid panel  Hyperlipidemia, unspecified hyperlipidemia type - Plan: Basic metabolic panel, CBC with Differential/Platelet, Hepatic function panel, Lipid panel  Sleep disturbance - Plan: Basic metabolic panel, CBC with Differential/Platelet, Hepatic function panel, Lipid panel  Adjustment disorder with depressed mood - Plan: Basic metabolic panel, CBC with Differential/Platelet, Hepatic function panel, Lipid panel  Bereavement  Counseled. Regarding above  Expectant management and discussion of plan and treatment with opportunity to ask questions and all were answered. The patient agreed with the plan and demonstrated an understanding of the instructions.   Advised to call back or seek an in-person evaluation if worsening  or having  further concerns . In the interim  And will plan  Fasting if possible blood monitoring and later    Future physical exam   In august sept  I provided 25  minutes of -face-to-face time during this encounter. 50 %   Counseling  Regarding  Bereavement  meds  And plan      Berniece Andreas, MD

## 2019-05-17 ENCOUNTER — Other Ambulatory Visit: Payer: Self-pay | Admitting: Internal Medicine

## 2019-05-23 ENCOUNTER — Other Ambulatory Visit (INDEPENDENT_AMBULATORY_CARE_PROVIDER_SITE_OTHER): Payer: PPO

## 2019-05-23 ENCOUNTER — Other Ambulatory Visit: Payer: Self-pay

## 2019-05-23 DIAGNOSIS — F4321 Adjustment disorder with depressed mood: Secondary | ICD-10-CM

## 2019-05-23 DIAGNOSIS — G479 Sleep disorder, unspecified: Secondary | ICD-10-CM

## 2019-05-23 DIAGNOSIS — Z79899 Other long term (current) drug therapy: Secondary | ICD-10-CM

## 2019-05-23 DIAGNOSIS — E785 Hyperlipidemia, unspecified: Secondary | ICD-10-CM

## 2019-05-23 DIAGNOSIS — I10 Essential (primary) hypertension: Secondary | ICD-10-CM

## 2019-05-23 LAB — HEPATIC FUNCTION PANEL
ALT: 14 U/L (ref 0–35)
AST: 13 U/L (ref 0–37)
Albumin: 4 g/dL (ref 3.5–5.2)
Alkaline Phosphatase: 102 U/L (ref 39–117)
Bilirubin, Direct: 0.1 mg/dL (ref 0.0–0.3)
Total Bilirubin: 0.6 mg/dL (ref 0.2–1.2)
Total Protein: 6.3 g/dL (ref 6.0–8.3)

## 2019-05-23 LAB — BASIC METABOLIC PANEL
BUN: 15 mg/dL (ref 6–23)
CO2: 28 mEq/L (ref 19–32)
Calcium: 8.9 mg/dL (ref 8.4–10.5)
Chloride: 105 mEq/L (ref 96–112)
Creatinine, Ser: 0.77 mg/dL (ref 0.40–1.20)
GFR: 73.55 mL/min (ref >60.00)
Glucose, Bld: 95 mg/dL (ref 70–99)
Potassium: 4.3 mEq/L (ref 3.5–5.1)
Sodium: 142 mEq/L (ref 135–145)

## 2019-05-23 LAB — CBC WITH DIFFERENTIAL/PLATELET
Basophils Absolute: 0 10*3/uL (ref 0.0–0.1)
Basophils Relative: 0.9 % (ref 0.0–3.0)
Eosinophils Absolute: 0.1 10*3/uL (ref 0.0–0.7)
Eosinophils Relative: 2.3 % (ref 0.0–5.0)
HCT: 39 % (ref 36.0–46.0)
Hemoglobin: 13.1 g/dL (ref 12.0–15.0)
Lymphocytes Relative: 40.9 % (ref 12.0–46.0)
Lymphs Abs: 1.4 10*3/uL (ref 0.7–4.0)
MCHC: 33.6 g/dL (ref 30.0–36.0)
MCV: 87.5 fl (ref 78.0–100.0)
Monocytes Absolute: 0.4 10*3/uL (ref 0.1–1.0)
Monocytes Relative: 10.4 % (ref 3.0–12.0)
Neutro Abs: 1.6 10*3/uL (ref 1.4–7.7)
Neutrophils Relative %: 45.5 % (ref 43.0–77.0)
Platelets: 276 10*3/uL (ref 150.0–400.0)
RBC: 4.45 Mil/uL (ref 3.87–5.11)
RDW: 13.5 % (ref 11.5–15.5)
WBC: 3.5 10*3/uL — ABNORMAL LOW (ref 4.0–10.5)

## 2019-05-23 LAB — LIPID PANEL
Cholesterol: 176 mg/dL (ref 0–200)
HDL: 52.5 mg/dL (ref >39.00)
LDL Cholesterol: 89 mg/dL (ref 0–99)
NonHDL: 123.65
Total CHOL/HDL Ratio: 3
Triglycerides: 172 mg/dL — ABNORMAL HIGH (ref 0.0–149.0)
VLDL: 34.4 mg/dL (ref 0.0–40.0)

## 2019-07-11 ENCOUNTER — Other Ambulatory Visit: Payer: Self-pay

## 2019-09-01 ENCOUNTER — Other Ambulatory Visit: Payer: Self-pay | Admitting: Internal Medicine

## 2019-09-01 NOTE — Telephone Encounter (Signed)
Will refill med  Today  Arrange   For cpx appt    For October November

## 2019-09-03 NOTE — Telephone Encounter (Signed)
Pt states she will call back to schedule cpe since she is going out of town

## 2019-09-26 ENCOUNTER — Encounter: Payer: Self-pay | Admitting: Family Medicine

## 2019-09-26 ENCOUNTER — Ambulatory Visit (INDEPENDENT_AMBULATORY_CARE_PROVIDER_SITE_OTHER): Payer: PPO | Admitting: Family Medicine

## 2019-09-26 ENCOUNTER — Ambulatory Visit: Payer: Self-pay | Admitting: *Deleted

## 2019-09-26 ENCOUNTER — Ambulatory Visit (INDEPENDENT_AMBULATORY_CARE_PROVIDER_SITE_OTHER): Payer: PPO

## 2019-09-26 ENCOUNTER — Other Ambulatory Visit: Payer: Self-pay

## 2019-09-26 VITALS — BP 140/80 | HR 81 | Temp 98.1°F | Resp 12 | Ht 64.0 in | Wt 180.4 lb

## 2019-09-26 DIAGNOSIS — W19XXXA Unspecified fall, initial encounter: Secondary | ICD-10-CM | POA: Diagnosis not present

## 2019-09-26 DIAGNOSIS — S0003XA Contusion of scalp, initial encounter: Secondary | ICD-10-CM

## 2019-09-26 DIAGNOSIS — Z23 Encounter for immunization: Secondary | ICD-10-CM | POA: Diagnosis not present

## 2019-09-26 DIAGNOSIS — Y92009 Unspecified place in unspecified non-institutional (private) residence as the place of occurrence of the external cause: Secondary | ICD-10-CM | POA: Diagnosis not present

## 2019-09-26 DIAGNOSIS — S065X0A Traumatic subdural hemorrhage without loss of consciousness, initial encounter: Secondary | ICD-10-CM | POA: Diagnosis not present

## 2019-09-26 NOTE — Telephone Encounter (Signed)
Summary: Fall   Pt stated she fell yesterday and hit her head. Stated she was fine and had no headaches or bruising but had a slight dent in forehead. NT unavailable. Please return call. Pt would like to know if she needs to do anything further.      Call to patient- patient reports a fall from tripping yesterday- she hit her forehead on windowsill and has small dime sized dent with swelling underneath the indention.Call to office for appointment  Reason for Disposition . [1] Age over 82 years AND [2] swelling or bruise  Answer Assessment - Initial Assessment Questions 1. MECHANISM: "How did the injury happen?" For falls, ask: "What height did you fall from?" and "What surface did you fall against?"      Patient tripped and hit the window sill.  2. ONSET: "When did the injury happen?" (Minutes or hours ago)      Yesterday- mid day 3. NEUROLOGIC SYMPTOMS: "Was there any loss of consciousness?" "Are there any other neurological symptoms?"      No loss of consciousness and no neurological symptom 4. MENTAL STATUS: "Does the person know who he is, who you are, and where he is?"      Patient is aware 5. LOCATION: "What part of the head was hit?"      R side of forehead in front at hairline 6. SCALP APPEARANCE: "What does the scalp look like? Is it bleeding now?" If so, ask: "Is it difficult to stop?"      Not cuts 7. SIZE: For cuts, bruises, or swelling, ask: "How large is it?" (e.g., inches or centimeters)      Dent in the forehead larger than dime 8. PAIN: "Is there any pain?" If so, ask: "How bad is it?"  (e.g., Scale 1-10; or mild, moderate, severe)     Painful if touches- mild 9. TETANUS: For any breaks in the skin, ask: "When was the last tetanus booster?"     n/a 10. OTHER SYMPTOMS: "Do you have any other symptoms?" (e.g., neck pain, vomiting)       no 11. PREGNANCY: "Is there any chance you are pregnant?" "When was your last menstrual period?"       n/a  Protocols used: HEAD  INJURY-A-AH

## 2019-09-26 NOTE — Progress Notes (Signed)
ACUTE VISIT   HPI:  Chief Complaint  Patient presents with  . Fall    fell on yesterday, hit head, slight swelling, right eye    [name] is a 73 y.o. female, who is here today c/o fall that happened yesterday in the middle of the day. She was trying to ger her cat,tripped and fell.  Hit head against wooden window frame, frontal,she was able to get up and kept going.  No visual changes,headache,MS changes,N/V,or focal neurologic deficit.  A few hours after fall edema and and ecchymosis developed. Stable.  She has not tried treatments.  She is concerned because she noted a small dent in right frontal area.  This is her first fall in the past year.  Review of Systems  Constitutional: Negative for activity change, appetite change, fatigue and fever.  HENT: Negative for mouth sores, nosebleeds and trouble swallowing.   Eyes: Negative for redness and visual disturbance.  Respiratory: Negative for cough, shortness of breath and wheezing.   Cardiovascular: Negative for chest pain, palpitations and leg swelling.  Gastrointestinal: Negative for abdominal pain, nausea and vomiting.       Negative for changes in bowel habits.  Genitourinary: Negative for decreased urine volume and hematuria.  Musculoskeletal: Negative for gait problem and myalgias.  Neurological: Negative for syncope, facial asymmetry and weakness.  Rest see pertinent positives and negatives per HPI.   Current Outpatient Medications on File Prior to Visit  Medication Sig Dispense Refill  . amLODipine (NORVASC) 2.5 MG tablet TAKE 1 TABLET BY MOUTH EVERY DAY 90 tablet 3  . atorvastatin (LIPITOR) 20 MG tablet TAKE 1 TABLET BY MOUTH EVERY DAY 90 tablet 1  . mirtazapine (REMERON) 15 MG tablet TAKE 1/2 TABLET BY MOUTH DAILY AT BEDTIME 45 tablet 0  . sertraline (ZOLOFT) 100 MG tablet Take 1 tablet (100 mg total) by mouth daily. 90 tablet 1   No current facility-administered medications on file prior  to visit.      Past Medical History:  Diagnosis Date  . 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 Chronic back pain   . Depression    mood reactive  . H/O: rheumatic fever    maybe  . Hyperlipidemia   . Right leg pain   . 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 Marital status: Married    Spouse name: Not on file  . Number of children: Not on file  . Years of education: Not on file  . Highest education level: Not on file  Occupational History  . Not on file  Social Needs  . Financial resource strain: Not on file  . Food insecurity    Worry: Not on file    Inability: Not on file  . Transportation needs    Medical: Not on file    Non-medical: Not on file  Tobacco Use  . Smoking status: Never Smoker  . Smokeless tobacco: Never Used  Substance and Sexual Activity  . Alcohol use: No  . Drug use: No  . Sexual activity: Not on file  Lifestyle  . Physical activity    Days per week: Not on file    Minutes per session: Not on file  . Stress: Not on file  Relationships  . Social Herbalist on phone: Not on file    Gets together: Not on file    Attends religious  service: Not on file    Active member of club or organization: Not on file    Attends meetings of clubs or organizations: Not on file    Relationship status: Not on file  Other Topics Concern  . 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    Husband pipe ets.   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    Physical Exam  Nursing note and vitals reviewed. Constitutional: She is oriented to person, place, and time. She  appears well-developed. No distress.  HENT:  Head: Normocephalic and atraumatic.    Mouth/Throat: Oropharynx is clear and moist and mucous membranes are normal.  Eyes: Pupils are equal, round, and reactive to light. Conjunctivae are normal.  Cardiovascular: Normal rate and regular rhythm.  No murmur heard. Pulses:      Dorsalis pedis pulses are 2+ on the right side and 2+ on the left side.  Respiratory: Effort normal and breath sounds normal. No respiratory distress.  GI: Soft. She exhibits no mass. There is no hepatomegaly. There is no abdominal tenderness.  Musculoskeletal:        General: No edema.  Lymphadenopathy:    She has no cervical adenopathy.  Neurological: She is alert and oriented to person, place, and time. She has normal strength. No cranial nerve deficit. Gait normal.  Skin: Skin is warm. Ecchymosis noted. No rash noted. No erythema.  Hematoma right fronto temporal + edema, there is a depressed area, tender. No crepitus.  Psychiatric: She has a normal mood and affect.  Well groomed, good eye contact.    ASSESSMENT AND PLAN:  Ms. Tiffany Lowe was seen today for fall.  Diagnoses and all orders for this visit:  Fall in home, initial encounter Fall precautions discussed. For now I do not think PT or other interventions are needed at this time.  Hematoma of frontal scalp, initial encounter Local ice for 72 hours. Avoid direct sun exposure. Discussed possible short and log term complications of head trauma. Neurologic examination today is normal,so I do not think head imaging is needed at this time.  Clearly instructed about warning signs.  -     DG Skull Complete  Need for influenza vaccination -     Flu Vaccine QUAD High Dose(Fluad)   Return if symptoms worsen or fail to improve.  -[name] was advised to seek immediate medical attention if sudden worsening symptoms or to follow if they persist or if new concerns arise.   Tiffany Lowe G. [place], MD   [facility]. [place] office.

## 2019-09-26 NOTE — Patient Instructions (Addendum)
A few things to remember from today's visit:   Fall in home, initial encounter  Hematoma of frontal scalp, initial encounter - Plan: DG Skull Complete Monitor for headache, confusion, visual changes, or fever.  Please be sure medication list is accurate. If a new problem present, please set up appointment sooner than planned today.

## 2019-09-26 NOTE — Telephone Encounter (Signed)
Pt was scheduled with Dr.Jordan

## 2019-10-07 NOTE — Progress Notes (Deleted)
No chief complaint on file.   HPI: Tiffany Lowe 73 y.o. comes in today for Preventive Medicare exam/ wellness visit  And Chronic disease management .Since last visit.  Health Maintenance  Topic Date Due  . Hepatitis C Screening  May 31, 1946  . DEXA SCAN  09/21/2011  . MAMMOGRAM  10/24/2014  . TETANUS/TDAP  09/30/2020  . Fecal DNA (Cologuard)  01/15/2021  . INFLUENZA VACCINE  Completed  . PNA vac Low Risk Adult  Completed   Health Maintenance Review LIFESTYLE:  Exercise:   Tobacco/ETS: Alcohol:  Sugar beverages: Sleep: Drug use: no HH:      Hearing:   Vision:  No limitations at present . Last eye check UTD  Safety:  Has smoke detector and wears seat belts.  No firearms. No excess sun exposure. Sees dentist regularly.  Falls:   Advance directive :  Reviewed  Has one.  Memory: Felt to be good  , no concern from her or her family.  Depression: No anhedonia unusual crying or depressive symptoms  Nutrition: Eats well balanced diet; adequate calcium and vitamin D. No swallowing chewing problems.  Injury: no major injuries in the last six months.  Other healthcare providers:  Reviewed today .  Social:  Lives with spouse married. No pets.   Preventive parameters: up-to-date  Reviewed   ADLS:   There are no problems or need for assistance  driving, feeding, obtaining food, dressing, toileting and bathing, managing money using phone. She is independent.  EXERCISE/ HABITS  Per week   No tobacco    etoh   ROS:  GEN/ HEENT: No fever, significant weight changes sweats headaches vision problems hearing changes, CV/ PULM; No chest pain shortness of breath cough, syncope,edema  change in exercise tolerance. GI /GU: No adominal pain, vomiting, change in bowel habits. No blood in the stool. No significant GU symptoms. SKIN/HEME: ,no acute skin rashes suspicious lesions or bleeding. No lymphadenopathy, nodules, masses.  NEURO/ PSYCH:  No neurologic signs such as  weakness numbness. No depression anxiety. IMM/ Allergy: No unusual infections.  Allergy .   REST of 12 system review negative except as per HPI   Past Medical History:  Diagnosis Date  . Anosmia 04/17/00   s/p viral  . Chronic back pain   . Depression    mood reactive  . H/O: rheumatic fever    maybe  . Hyperlipidemia   . Right leg pain   . 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 Alzheimer's disease Father        died 17-Apr-2005  . Diverticulitis Mother        with colostomy.   . Coronary artery disease Other   . Hyperlipidemia Other   . Stroke Other     Social History   Socioeconomic History  . Marital status: Married    Spouse name: Not on file  . Number of children: Not on file  . Years of education: Not on file  . Highest education level: Not on file  Occupational History  . Not on file  Social Needs  . Financial resource strain: Not on file  . Food insecurity    Worry: Not on file    Inability: Not on file  . Transportation needs    Medical: Not on file    Non-medical: Not on file  Tobacco Use  . Smoking status: Never Smoker  . Smokeless tobacco: Never Used  Substance  and Sexual Activity  . Alcohol use: No  . Drug use: No  . Sexual activity: Not on file  Lifestyle  . Physical activity    Days per week: Not on file    Minutes per session: Not on file  . Stress: Not on file  Relationships  . Social Musician on phone: Not on file    Gets together: Not on file    Attends religious service: Not on file    Active member of club or organization: Not on file    Attends meetings of clubs or organizations: Not on file    Relationship status: Not on file  Other Topics Concern  . 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     Husband pipe ets.   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 amLODipine (NORVASC) 2.5 MG tablet TAKE 1 TABLET BY MOUTH EVERY DAY  . atorvastatin (LIPITOR) 20 MG tablet TAKE 1 TABLET BY MOUTH EVERY DAY  . mirtazapine (REMERON) 15 MG tablet TAKE 1/2 TABLET BY MOUTH DAILY AT BEDTIME  . sertraline (ZOLOFT) 100 MG tablet Take 1 tablet (100 mg total) by mouth daily.   No facility-administered encounter medications on file as of 10/08/2019.     EXAM:  There were no vitals taken for this visit.  There is no height or weight on file to calculate BMI.  Physical Exam: Vital signs reviewed JQG:BEEF is a well-developed well-nourished alert cooperative   who appears stated age in no acute distress.  HEENT: normocephalic atraumatic , Eyes: PERRL EOM's full, conjunctiva clear, Nares: paten,t no deformity discharge or tenderness., Ears: no deformity EAC's clear TMs with normal landmarks. Mouth: clear OP, no lesions, edema.  Moist mucous membranes. Dentition in adequate repair. NECK: supple without masses, thyromegaly or bruits. CHEST/PULM:  Clear to auscultation and percussion breath sounds equal no wheeze , rales or rhonchi. No chest wall deformities or tenderness. CV: PMI is nondisplaced, S1 S2 no gallops, murmurs, rubs. Peripheral pulses are full without delay.No JVD .  ABDOMEN: Bowel sounds normal nontender  No guard or rebound, no hepato splenomegal no CVA tenderness.   Extremtities:  No clubbing cyanosis or edema, no acute joint swelling or redness no focal atrophy NEURO:  Oriented x3, cranial nerves 3-12 appear to be intact, no obvious focal weakness,gait within normal limits no abnormal reflexes or asymmetrical SKIN: No acute rashes normal turgor, color, no bruising or petechiae. PSYCH: Oriented, good eye contact, no obvious depression anxiety, cognition and judgment appear normal. LN: no cervical  axillary inguinal adenopathy No noted deficits in memory, attention, and speech.   Lab Results  Component Value Date   WBC 3.5 (L) 05/23/2019   HGB 13.1 05/23/2019   HCT 39.0 05/23/2019   PLT 276.0 05/23/2019   GLUCOSE 95 05/23/2019   CHOL 176 05/23/2019   TRIG 172.0 (H) 05/23/2019   HDL 52.50 05/23/2019   LDLDIRECT 141.0 05/03/2018   LDLCALC 89 05/23/2019   ALT 14 05/23/2019   AST 13 05/23/2019   NA 142 05/23/2019   K 4.3 05/23/2019   CL 105 05/23/2019  CREATININE 0.77 05/23/2019   BUN 15 05/23/2019   CO2 28 05/23/2019   TSH 0.57 07/12/2016   INR 3.6 09/15/2014   HGBA1C 5.7 07/12/2016    ASSESSMENT AND PLAN:  Discussed the following assessment and plan:  Visit for preventive health examination  Essential hypertension  Hyperlipidemia, unspecified hyperlipidemia type  Adjustment disorder with depressed mood Hep c dexa mammo  Patient Care Team: Madelin HeadingsPanosh, Wanda K, MD as PCP - Clarisa KindredGeneral Gioffre, Ronald, MD as Attending Physician (Orthopedic Surgery)  There are no Patient Instructions on file for this visit.  Neta MendsWanda K. Panosh M.D.

## 2019-10-08 ENCOUNTER — Encounter: Payer: PPO | Admitting: Internal Medicine

## 2019-10-15 NOTE — Progress Notes (Signed)
Chief Complaint  Patient presents with  . Vaginitis    pt believes to have a yeast infection she states she tried [employer] and hasn't gone aorund   . Memory Loss    Pt states she is becoming very forgetful like she made coffee this miorning and then didn't remember doing it again   . dry cough    Pt sates she has had a dry cough for over 6 months that she is concerned about     HPI: Tiffany Lowe 73 y.o. come in for  Multiple issues  Over due for yearly check  Has had a tough year   Last visit a year ago  Husband  .  Passed in dec 19 moving in 2 weeks  to [place] mom age 73 old home and closer to her son from [place].        Vaginal :   For a while .    Itching like crazy    Used otc  Here and there.  Cran berry juice no internal  No bleeding pain  Lesion  remot hs of year.  Momry make coffe and forgot she made it  But  Stressed and sad at times  This year  Caretaker .  Felt ripped ( over cat caget?)on head  End of October   no concussion    No inblance or neuro sx  No getting lost and can do finances   BP  Controlled  On med  Stress and mood ? Go up on the sertraline 100? Not suicidal tried back on remeron and had vivid dreams unpleasnt so using benadryl night prep   Dry cough for about 6 mos without other sx fever sob wheeze allergy sx  Her om was concern about this   HLD taking medication  without problem   Has HB a good bit at night  Takes prn tums no weigh tloss vomiting  Sob      ROS: See pertinent positives and negatives per HPI. See above  No ha vision changes   Past Medical History:  Diagnosis Date  . 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 Chronic back pain   . Depression    mood reactive  . H/O: rheumatic fever    maybe  . Hyperlipidemia   . Right leg pain   . 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 Alzheimer's disease Father        died 2006  . Diverticulitis Mother        with  colostomy.   . Coronary artery disease Other   . Hyperlipidemia Other   . Stroke Other     Social History   Socioeconomic History  . Marital status: Married    Spouse name: Not on file  . Number of children: Not on file  . Years of education: Not on file  . Highest education level: Not on file  Occupational History  . Not on file  Social Needs  . Financial resource strain: Not on file  . Food insecurity    Worry: Not on file    Inability: Not on file  . Transportation needs    Medical: Not on file    Non-medical: Not on file  Tobacco Use  . Smoking status: Never Smoker  . Smokeless tobacco: Never Used  Substance and Sexual Activity  . Alcohol use: No  . Drug use: No  .  Sexual activity: Not on file  Lifestyle  . Physical activity    Days per week: Not on file    Minutes per session: Not on file  . Stress: Not on file  Relationships  . Social Herbalist on phone: Not on file    Gets together: Not on file    Attends religious service: Not on file    Active member of club or organization: Not on file    Attends meetings of clubs or organizations: Not on file    Relationship status: Not on file  Other Topics Concern  . 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  Now 4 hours per day   X 5 fmla cause of mom  Stopped august 2014 cause of care.    Husband pipe ets.   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 amLODipine (NORVASC) 2.5 MG tablet TAKE 1 TABLET BY MOUTH EVERY DAY 90 tablet 3  . atorvastatin (LIPITOR) 20 MG tablet TAKE 1 TABLET BY MOUTH EVERY DAY 90 tablet 1  . mirtazapine (REMERON) 15 MG tablet TAKE 1/2 TABLET BY MOUTH DAILY AT BEDTIME 45 tablet 0  . sertraline (ZOLOFT) 100 MG tablet Take 1 tablet (100 mg total) by mouth daily. 90 tablet 1   No facility-administered  medications prior to visit.      EXAM:  BP 132/76 (BP Location: Right Arm, Patient Position: Sitting, Cuff Size: Normal)   Pulse 86   Temp 97.6 F (36.4 C) (Temporal)   Wt 179 lb 6.4 oz (81.4 kg)   SpO2 98%   BMI 30.79 kg/m   Body mass index is 30.79 kg/m.  GENERAL: vitals reviewed and listed above, alert, oriented, appears well hydrated and in no acute distress tearful at times  HEENT: fading bruise righ forehead , conjunctiva  clear, no obvious abnormalities on inspection of external nose and ears OP : masked    NECK: no obvious masses on inspection palpation  LUNGS: clear to auscultation bilaterally, no wheezes, rales or rhonchi, good air movement CV: HRRR, no clubbing cyanosis or  peripheral edema nl cap refill  Abdomen:  Sof,t normal bowel sounds without hepatosplenomegaly, no guarding rebound or masses no CVA tenderness MS: moves all extremities without noticeable focal  abnormality PSYCH: pleasant and cooperative,emotional at times   Nl speech affect   Oriented  GU vag  Erythema no lesions  And atrophic  Swap aptima  Taken  Sent  NEURO: oriented x 3 CN 3-12 appear intact. No focal muscle weakness or atrophy. DTRs symmetrical. Gait WNL.  Grossly non focal. No tremor or abnormal movement.  Lab Results  Component Value Date   WBC 4.7 10/17/2019   HGB 13.7 10/17/2019   HCT 40.6 10/17/2019   PLT 292.0 10/17/2019   GLUCOSE 103 (H) 10/17/2019   CHOL 163 10/17/2019   TRIG 115.0 10/17/2019   HDL 57.90 10/17/2019   LDLDIRECT 141.0 05/03/2018   LDLCALC 82 10/17/2019   ALT 15 10/17/2019   AST 14 10/17/2019   NA 142 10/17/2019   K 4.7 10/17/2019   CL 106 10/17/2019   CREATININE 0.75 10/17/2019   BUN 14 10/17/2019   CO2 27 10/17/2019   TSH 1.41 10/17/2019   INR  3.6 09/15/2014   HGBA1C 6.1 10/17/2019   BP Readings from Last 3 Encounters:  10/17/19 132/76  09/26/19 140/80  05/03/18 136/88   Lab Results  Component Value Date   VITAMINB12 307 10/17/2019  lab NF had a  bsmall bagel this am   ASSESSMENT AND PLAN:  Discussed the following assessment and plan:  Essential hypertension - Plan: Basic metabolic panel, CBC with Differential, Hemoglobin A1c, Hepatic function panel, Lipid panel, TSH, T4, Free (Thyrox), B12  Persistent cough for 3 weeks or longer - nl exam add prilosec and c xray and fu  - Plan: Basic metabolic panel, CBC with Differential, Hemoglobin A1c, Hepatic function panel, Lipid panel, TSH, T4, Free (Thyrox), B12, DG Chest 2 View, DG Chest 2 View  Need for influenza vaccination  Medication management - Plan: Basic metabolic panel, CBC with Differential, Hemoglobin A1c, Hepatic function panel, Lipid panel, TSH, T4, Free (Thyrox), B12  Hyperlipidemia, unspecified hyperlipidemia type - Plan: Basic metabolic panel, CBC with Differential, Hemoglobin A1c, Hepatic function panel, Lipid panel, TSH, T4, Free (Thyrox), B12  Sleep disturbance - Plan: Basic metabolic panel, CBC with Differential, Hemoglobin A1c, Hepatic function panel, Lipid panel, TSH, T4, Free (Thyrox), B12  Bereavement - Plan: Basic metabolic panel, CBC with Differential, Hemoglobin A1c, Hepatic function panel, Lipid panel, TSH, T4, Free (Thyrox), B12  Complaints of memory disturbance - so much going on hard to tease out exam good labs pending and plan fu after move virtual visit  - Plan: Basic metabolic panel, CBC with Differential, Hemoglobin A1c, Hepatic function panel, Lipid panel, TSH, T4, Free (Thyrox), B12  Chronic vaginitis - Plan: Cervicovaginal ancillary only, CANCELED: Cervicovaginal ancillary only  Heart burn - nocturnal could be contributing to  cough?   Adjustment disorder with depressed mood - can try inc sertraline to 150 mg per day  if wishes  Total visit 45 mins > 50% spent counseling and coordinating care as indicated in above note and in instructions to patient .   Multiple problems addressed  Bereavement memory  meds  Se   Etc   Hb  And vaginal management   -Patient advised to return or notify health care team  if  new concerns arise. Going to fu virtual   After moving  Patient Instructions  So will let you know when vaginal swab comes back  But can treat for year in the interim and uyou can use otc  Hydrocortisone and monistat on outside if needed  For itching   c xrqy to day for cough  Could be from reflux heart burn. Add  otc Prilosec for   3 weeks   . See if helps the HB  Cough. May take longer  Advise a follow up in about 6 weeks but you may need to established with doctors where you move.   onc Can increase the sertraline to 150 mg poer day and not use th remeron cause of side effetct.    forgetful ness can be from a lot of problems  And you should have a follow up about this  Here or where you move .        Neta Mends. Panosh M.D.

## 2019-10-17 ENCOUNTER — Other Ambulatory Visit: Payer: Self-pay

## 2019-10-17 ENCOUNTER — Encounter: Payer: Self-pay | Admitting: Internal Medicine

## 2019-10-17 ENCOUNTER — Ambulatory Visit (INDEPENDENT_AMBULATORY_CARE_PROVIDER_SITE_OTHER): Payer: PPO | Admitting: Internal Medicine

## 2019-10-17 ENCOUNTER — Other Ambulatory Visit (HOSPITAL_COMMUNITY)
Admission: RE | Admit: 2019-10-17 | Discharge: 2019-10-17 | Disposition: A | Discharge status: Home / Self Care | Payer: PPO | Source: Ambulatory Visit | Attending: Internal Medicine | Admitting: Internal Medicine

## 2019-10-17 ENCOUNTER — Ambulatory Visit (INDEPENDENT_AMBULATORY_CARE_PROVIDER_SITE_OTHER): Payer: PPO

## 2019-10-17 VITALS — BP 132/76 | HR 86 | Temp 97.6°F | Wt 179.4 lb

## 2019-10-17 DIAGNOSIS — F4321 Adjustment disorder with depressed mood: Secondary | ICD-10-CM

## 2019-10-17 DIAGNOSIS — N761 Subacute and chronic vaginitis: Secondary | ICD-10-CM | POA: Diagnosis not present

## 2019-10-17 DIAGNOSIS — Z79899 Other long term (current) drug therapy: Secondary | ICD-10-CM

## 2019-10-17 DIAGNOSIS — G479 Sleep disorder, unspecified: Secondary | ICD-10-CM

## 2019-10-17 DIAGNOSIS — E785 Hyperlipidemia, unspecified: Secondary | ICD-10-CM | POA: Diagnosis not present

## 2019-10-17 DIAGNOSIS — R413 Other amnesia: Secondary | ICD-10-CM

## 2019-10-17 DIAGNOSIS — Z634 Disappearance and death of family member: Secondary | ICD-10-CM

## 2019-10-17 DIAGNOSIS — Z23 Encounter for immunization: Secondary | ICD-10-CM | POA: Diagnosis not present

## 2019-10-17 DIAGNOSIS — R12 Heartburn: Secondary | ICD-10-CM | POA: Diagnosis not present

## 2019-10-17 DIAGNOSIS — R053 Chronic cough: Secondary | ICD-10-CM

## 2019-10-17 DIAGNOSIS — R05 Cough: Secondary | ICD-10-CM

## 2019-10-17 DIAGNOSIS — I10 Essential (primary) hypertension: Secondary | ICD-10-CM

## 2019-10-17 LAB — BASIC METABOLIC PANEL
BUN: 14 mg/dL (ref 6–23)
CO2: 27 mEq/L (ref 19–32)
Calcium: 9.4 mg/dL (ref 8.4–10.5)
Chloride: 106 mEq/L (ref 96–112)
Creatinine, Ser: 0.75 mg/dL (ref 0.40–1.20)
GFR: 75.73 mL/min (ref >60.00)
Glucose, Bld: 103 mg/dL — ABNORMAL HIGH (ref 70–99)
Potassium: 4.7 mEq/L (ref 3.5–5.1)
Sodium: 142 mEq/L (ref 135–145)

## 2019-10-17 LAB — VITAMIN B12: Vitamin B-12: 307 pg/mL (ref 211–911)

## 2019-10-17 LAB — CBC WITH DIFFERENTIAL/PLATELET
Basophils Absolute: 0.1 10*3/uL (ref 0.0–0.1)
Basophils Relative: 1.1 % (ref 0.0–3.0)
Eosinophils Absolute: 0.1 10*3/uL (ref 0.0–0.7)
Eosinophils Relative: 2.2 % (ref 0.0–5.0)
HCT: 40.6 % (ref 36.0–46.0)
Hemoglobin: 13.7 g/dL (ref 12.0–15.0)
Lymphocytes Relative: 35.8 % (ref 12.0–46.0)
Lymphs Abs: 1.7 10*3/uL (ref 0.7–4.0)
MCHC: 33.8 g/dL (ref 30.0–36.0)
MCV: 87.7 fl (ref 78.0–100.0)
Monocytes Absolute: 0.5 10*3/uL (ref 0.1–1.0)
Monocytes Relative: 10.3 % (ref 3.0–12.0)
Neutro Abs: 2.4 10*3/uL (ref 1.4–7.7)
Neutrophils Relative %: 50.6 % (ref 43.0–77.0)
Platelets: 292 10*3/uL (ref 150.0–400.0)
RBC: 4.63 Mil/uL (ref 3.87–5.11)
RDW: 13.2 % (ref 11.5–15.5)
WBC: 4.7 10*3/uL (ref 4.0–10.5)

## 2019-10-17 LAB — LIPID PANEL
Cholesterol: 163 mg/dL (ref 0–200)
HDL: 57.9 mg/dL (ref >39.00)
LDL Cholesterol: 82 mg/dL (ref 0–99)
NonHDL: 104.88
Total CHOL/HDL Ratio: 3
Triglycerides: 115 mg/dL (ref 0.0–149.0)
VLDL: 23 mg/dL (ref 0.0–40.0)

## 2019-10-17 LAB — HEPATIC FUNCTION PANEL
ALT: 15 U/L (ref 0–35)
AST: 14 U/L (ref 0–37)
Albumin: 4.4 g/dL (ref 3.5–5.2)
Alkaline Phosphatase: 110 U/L (ref 39–117)
Bilirubin, Direct: 0.1 mg/dL (ref 0.0–0.3)
Total Bilirubin: 0.6 mg/dL (ref 0.2–1.2)
Total Protein: 6.8 g/dL (ref 6.0–8.3)

## 2019-10-17 LAB — HEMOGLOBIN A1C: Hgb A1c MFr Bld: 6.1 % (ref 4.6–6.5)

## 2019-10-17 LAB — T4, FREE: Free T4: 0.93 ng/dL (ref 0.60–1.60)

## 2019-10-17 LAB — TSH: TSH: 1.41 u[IU]/mL (ref 0.35–4.50)

## 2019-10-17 MED ORDER — FLUCONAZOLE 150 MG PO TABS: 150.0000 mg | ORAL_TABLET | Freq: Once | ORAL | 0 refills | Status: AC

## 2019-10-17 MED ORDER — SERTRALINE HCL 100 MG PO TABS: 150.0000 mg | ORAL_TABLET | Freq: Every day | ORAL | 1 refills | Status: DC

## 2019-10-17 NOTE — Patient Instructions (Addendum)
So will let you know when vaginal swab comes back  But can treat for year in the interim and uyou can use otc  Hydrocortisone and monistat on outside if needed  For itching   c xrqy to day for cough  Could be from reflux heart burn. Add  otc Prilosec for   3 weeks   . See if helps the HB  Cough. May take longer  Advise a follow up in about 6 weeks but you may need to established with doctors where you move.   onc Can increase the sertraline to 150 mg poer day and not use th remeron cause of side effetct.    forgetful ness can be from a lot of problems  And you should have a follow up about this  Here or where you move .

## 2019-10-20 LAB — CERVICOVAGINAL ANCILLARY ONLY
Candida Glabrata: NEGATIVE
Candida Vaginitis: NEGATIVE
Comment: NEGATIVE
Comment: NEGATIVE

## 2019-10-27 ENCOUNTER — Other Ambulatory Visit: Payer: Self-pay

## 2019-10-27 ENCOUNTER — Telehealth: Payer: Self-pay | Admitting: Internal Medicine

## 2019-10-27 ENCOUNTER — Telehealth: Payer: Self-pay

## 2019-10-27 MED ORDER — HYDROCORTISONE 2.5 % EX CREA: TOPICAL_CREAM | Freq: Two times a day (BID) | CUTANEOUS | 0 refills | Status: DC

## 2019-10-27 NOTE — Telephone Encounter (Signed)
Pt having burning when putting it on

## 2019-10-27 NOTE — Telephone Encounter (Signed)
Copied from [place] [id number]. Topic: General - Other >> Oct 27, 2019 10:54 AM Rainey Pines A wrote: Patient was seen recently for yeast infection and stated that the medication  she was prescribed isnt working. Patient stated that she is burning,itching, and raw. Patient wants a callback today at [phone]   Please advise hydrocortisone and vagisil not working

## 2019-10-27 NOTE — Telephone Encounter (Signed)
Cortisone

## 2019-10-27 NOTE — Telephone Encounter (Signed)
So it is possible that  You have become sensitized to  Any topicals  At this time  .   So no topicals   Except  can use  otc domeboro soaks to soothe the itching    ( they come in packets that dissolve or other and can use for  A compressed for itching .   We may have to get  Gyne to see you .   Contact us in a week about how doing.

## 2019-10-27 NOTE — Telephone Encounter (Signed)
Patient returned call and was read note by Dr Regis Bill 10/27/2019. Patient will pick up domeboro for compresses.  She will call back for any question or concern

## 2019-10-27 NOTE — Telephone Encounter (Signed)
Please send in  Hydrocortisone 2.5 mg  Cream  touse externally only bid for itching  And use for the next week  Tell her this is a mild cortisone and not  For yeast ( but her yeast test came back negative)

## 2019-10-27 NOTE — Telephone Encounter (Signed)
lvm for pt to call back crm created okay to disclose  

## 2019-10-28 NOTE — Telephone Encounter (Signed)
See other phone note

## 2019-11-13 ENCOUNTER — Telehealth: Payer: Self-pay | Admitting: Internal Medicine

## 2019-11-13 NOTE — Telephone Encounter (Signed)
See note

## 2019-11-13 NOTE — Telephone Encounter (Signed)
Please advise 

## 2019-11-13 NOTE — Telephone Encounter (Signed)
Patient states she was tested for yeats infection a week in a half ago.  She was negative.  Patient states she is having serve itching 24/7.  She would like something called in for her as soon as possible.  Patient moved to [place], [place] so she did change pharmacies.  Patient call back [phone]  [facility]  [address] [place], [place] [address] [phone]

## 2019-11-14 MED ORDER — TRIAMCINOLONE ACETONIDE 0.1 % EX OINT: 1.0000 "application " | TOPICAL_OINTMENT | Freq: Two times a day (BID) | CUTANEOUS | 0 refills | Status: AC

## 2019-11-14 NOTE — Telephone Encounter (Signed)
Medication sent in. 

## 2019-11-14 NOTE — Telephone Encounter (Signed)
Left voicemail for pt to call back crm created okay to disclose  

## 2019-11-14 NOTE — Telephone Encounter (Signed)
Can send in   Med as pended but please tell her that to use it externally  where itching is only as needed and no more than 10 - 14 days  In a row  .  If still going on she may need to see gyne  But    Hopefully will help.

## 2019-11-14 NOTE — Telephone Encounter (Signed)
Tried to call pt again for the second time no answer was unable to leave voice mail

## 2019-11-27 ENCOUNTER — Other Ambulatory Visit: Payer: Self-pay | Admitting: Internal Medicine

## 2019-12-16 ENCOUNTER — Other Ambulatory Visit: Payer: Self-pay

## 2019-12-16 ENCOUNTER — Telehealth: Payer: PPO | Admitting: Internal Medicine

## 2019-12-16 ENCOUNTER — Encounter: Payer: Self-pay | Admitting: Internal Medicine

## 2019-12-16 ENCOUNTER — Telehealth (INDEPENDENT_AMBULATORY_CARE_PROVIDER_SITE_OTHER): Payer: Medicare Other | Admitting: Internal Medicine

## 2019-12-16 DIAGNOSIS — N898 Other specified noninflammatory disorders of vagina: Secondary | ICD-10-CM | POA: Diagnosis not present

## 2019-12-16 DIAGNOSIS — Z79899 Other long term (current) drug therapy: Secondary | ICD-10-CM

## 2019-12-16 DIAGNOSIS — F4321 Adjustment disorder with depressed mood: Secondary | ICD-10-CM

## 2019-12-16 DIAGNOSIS — E785 Hyperlipidemia, unspecified: Secondary | ICD-10-CM

## 2019-12-16 DIAGNOSIS — I10 Essential (primary) hypertension: Secondary | ICD-10-CM

## 2019-12-16 MED ORDER — ATORVASTATIN CALCIUM 20 MG PO TABS: 20.0000 mg | ORAL_TABLET | Freq: Every day | ORAL | 2 refills | Status: DC

## 2019-12-16 NOTE — Progress Notes (Signed)
Virtual Visit via Telephone Note  I connected with@ on 12/16/19 at  2:00 PM EST by telephone and verified that I am speaking with the correct person using two identifiers.   I discussed the limitations, risks, security and privacy concerns of performing an evaluation and management service by telephone and the availability of in person appointments. I also discussed with the patient that there may be a patient responsible charge related to this service. The patient expressed understanding and agreed to proceed.  Location patient: home Location provider: work office Participants present for the call: patient, provider Patient did not have a visit in the prior 7 days to address this/these issue(s).   History of Present Illness: Tiffany Lowe presents with follow-up of a couple of issues Mostly the vaginal perineal itching that did not respond to antiyeast or mild hydrocortisone treatment with a generally normal exam.  She has been using the triamcinolone ointment and it is helped a whole lot and has minimal symptoms now using it at night.  She states "I never want to have that again". She is now moved to the [facility] area helping her mom getting her medical care and working on 2 households. The sertraline has been helpful would like to continue is not taking the Remeron because it caused wild dreams but it taking occasional Tylenol PM once or twice a week.  She needs a refill of her atorvastatin had lab work done in November. Has not checked her blood pressure recently but is taking medication.    Observations/Objective: Patient sounds personable and well on the phone.  She sounds positive and cheerful.  I do not appreciate any SOB. Speech and thought processing are grossly intact. Patient reported vitals: Lab Results  Component Value Date   WBC 4.7 10/17/2019   HGB 13.7 10/17/2019   HCT 40.6 10/17/2019   PLT 292.0 10/17/2019   GLUCOSE 103 (H) 10/17/2019   CHOL 163  10/17/2019   TRIG 115.0 10/17/2019   HDL 57.90 10/17/2019   LDLDIRECT 141.0 05/03/2018   LDLCALC 82 10/17/2019   ALT 15 10/17/2019   AST 14 10/17/2019   NA 142 10/17/2019   K 4.7 10/17/2019   CL 106 10/17/2019   CREATININE 0.75 10/17/2019   BUN 14 10/17/2019   CO2 27 10/17/2019   TSH 1.41 10/17/2019   INR 3.6 09/15/2014   HGBA1C 6.1 10/17/2019    Assessment and Plan:  Vaginal itching  Hyperlipidemia, unspecified hyperlipidemia type  Medication management  Adjustment disorder with depressed mood - doing better  stay on meds   Essential hypertension Appears that she is doing much better mood wise stable probably after the move.  Her vaginal itching is significantly improved on middle strength topicals steroids but uncertain cause.  Did not see evidence of lichen sclerosus on her exam.  At this point we will continue nightly and then every other night and as needed and she can get back with [place] about further needs She can send in blood pressure readings when she is chance to check and continue medication as planned Due for yearly checkup in November if she does not establish with other PCP we can help out in the meantime or see her yearly here. She can contact us for refills sent to her local pharmacy in the interim.  Follow Up Instructions:  See above    [id number] 5-10 [id number] 11-20 [id number] 21-30 I did not refer this patient for an OV in the next 24 hours for this/these  issue(s).  I discussed the assessment and treatment plan with the patient. The patient was provided an opportunity to ask questions and all were answered. The patient agreed with the plan and demonstrated an understanding of the instructions.   The patient was advised to call back or seek an in-person evaluation if the symptoms worsen or if the condition fails to improve as anticipated.  I provided 13  minutes of non-face-to-face time during this encounter. Return for when planned or as needed .  Berniece Andreas,  MD

## 2020-02-29 IMAGING — DX DG SKULL COMPLETE 4+V
4 series · 4 of 4 positions shown · non-contrast
Comparison: None

CLINICAL DATA: Fall yesterday, right frontotemporal hematoma.

EXAM:
SKULL - COMPLETE 4 + VIEW

[skull pa]
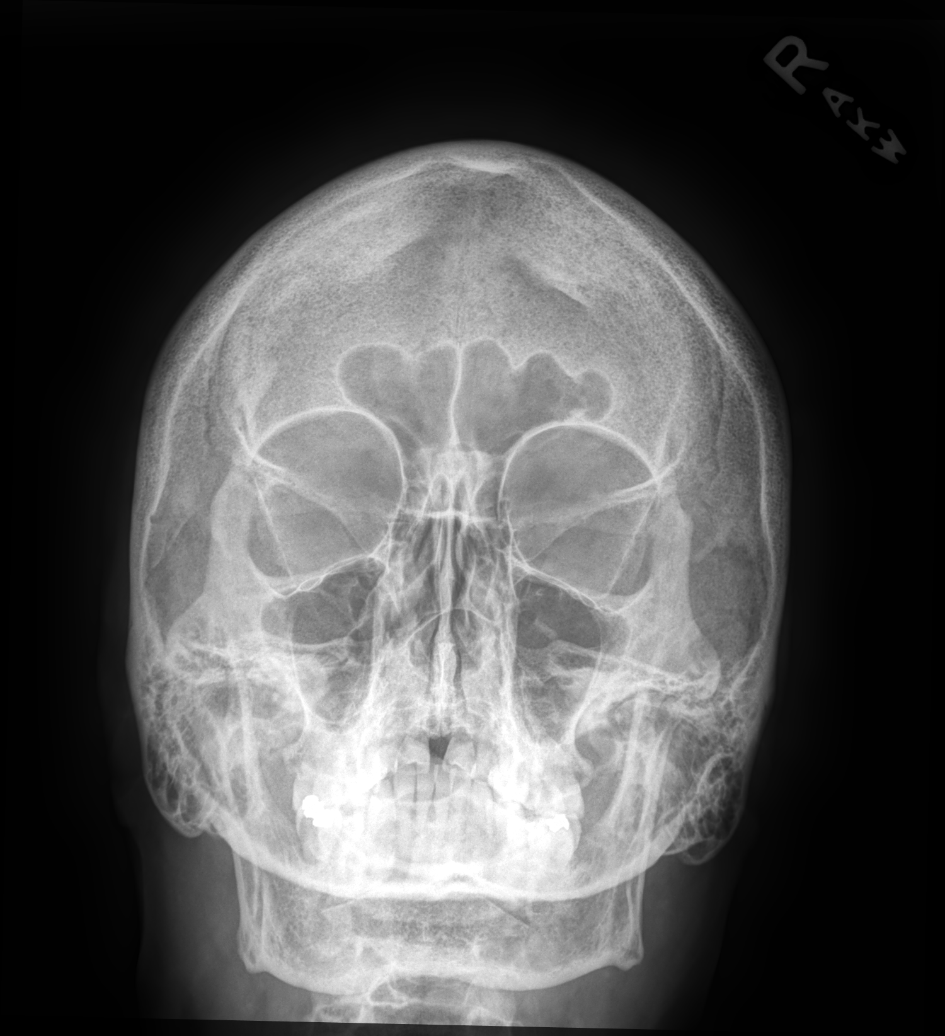

[skull townes ap]
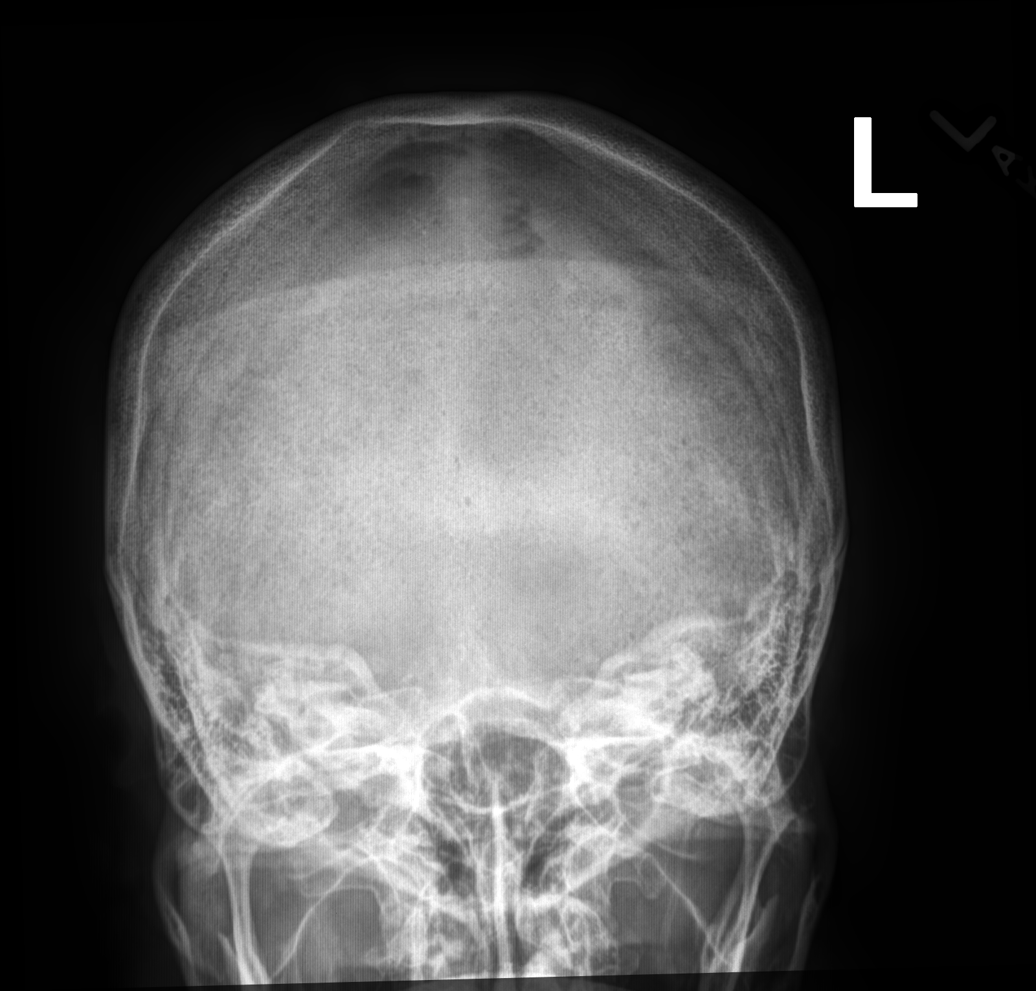

[skull lat (1 of 2)]
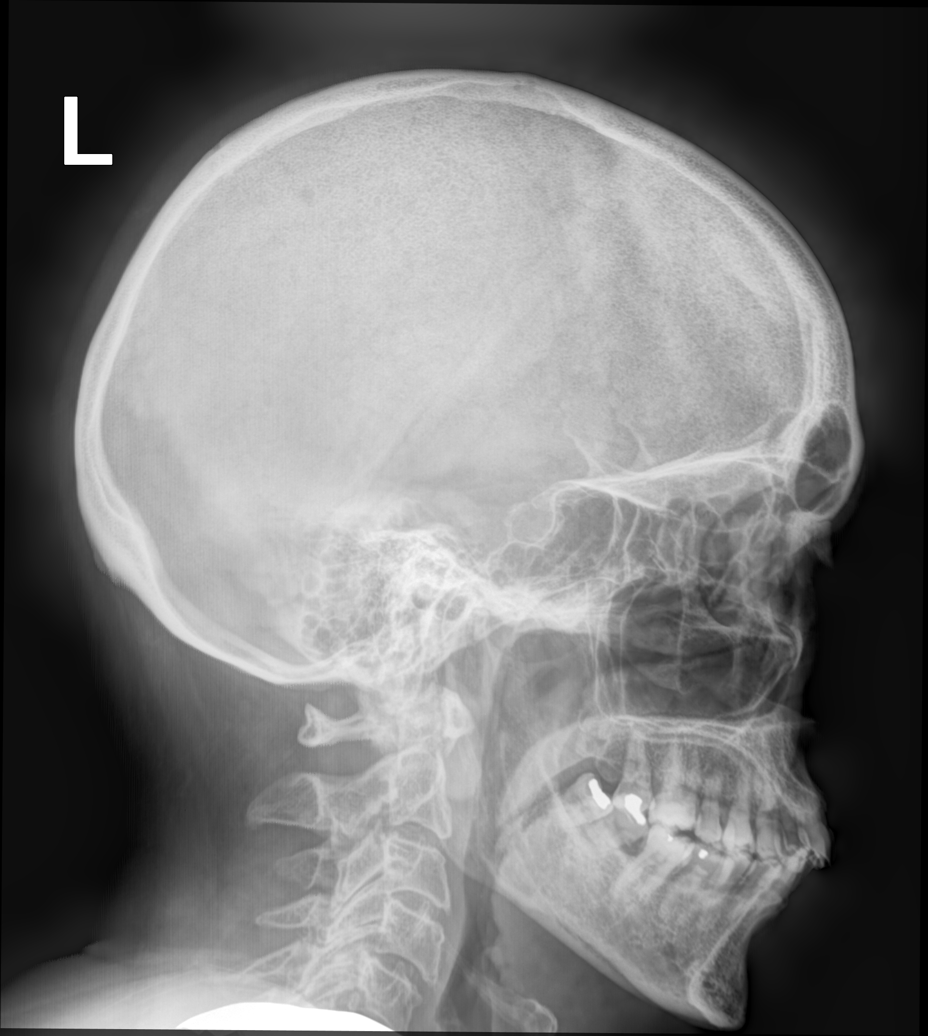

[skull lat (2 of 2)]
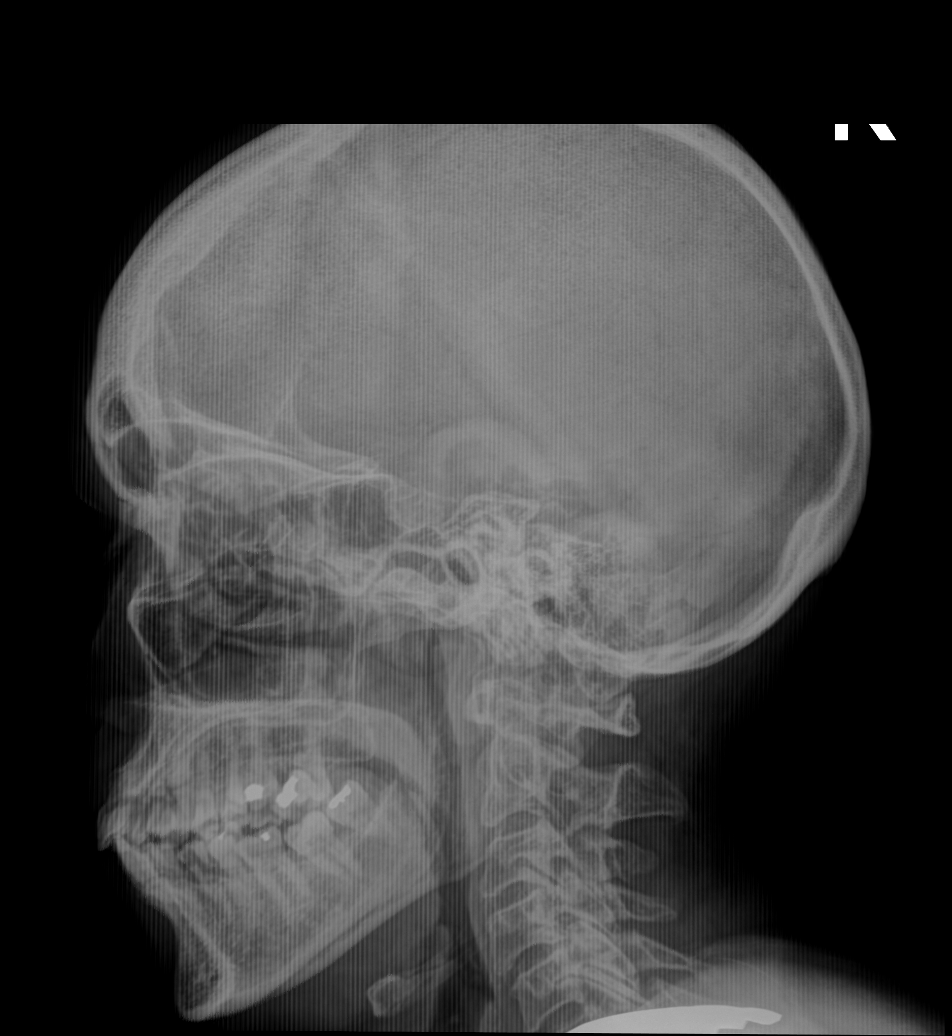

[4 of 4 positions shown; findings below may reference images not displayed]

FINDINGS: There is no evidence of skull fracture or other focal bone lesions.
IMPRESSION: Negative.

## 2020-03-17 ENCOUNTER — Other Ambulatory Visit: Payer: Self-pay | Admitting: Internal Medicine

## 2020-06-10 ENCOUNTER — Telehealth: Payer: Self-pay | Admitting: Internal Medicine

## 2020-06-10 NOTE — Telephone Encounter (Signed)
Left message for patient to schedule Annual Wellness Visit.  Please schedule with Nurse Health Advisor Shannon Crews, RN at [facility]  

## 2020-07-16 ENCOUNTER — Other Ambulatory Visit: Payer: Self-pay | Admitting: Internal Medicine

## 2020-09-05 ENCOUNTER — Other Ambulatory Visit: Payer: Self-pay | Admitting: Internal Medicine

## 2020-11-12 ENCOUNTER — Telehealth: Payer: Self-pay | Admitting: Internal Medicine

## 2020-11-12 NOTE — Telephone Encounter (Signed)
Left message for patient to call back and schedule Medicare Annual Wellness Visit (AWV) either virtually or in office.   Last AWV 11/12/18  please schedule at anytime with [facility] Nurse Health Advisor 1 or 2   This should be a 45 minute visit. 

## 2021-01-11 ENCOUNTER — Other Ambulatory Visit: Payer: Self-pay | Admitting: Internal Medicine

## 2021-01-25 ENCOUNTER — Other Ambulatory Visit: Payer: Self-pay | Admitting: Internal Medicine

## 2021-02-10 ENCOUNTER — Telehealth: Payer: Self-pay | Admitting: Internal Medicine

## 2021-02-10 NOTE — Telephone Encounter (Signed)
Left message for patient to call back and schedule Medicare Annual Wellness Visit (AWV) either virtually or in office. No detailed message   Last AWV 01/02/18  please schedule at anytime with [facility] Nurse Health Advisor 1 or 2   This should be a 45 minute visit.

## 2021-05-11 ENCOUNTER — Telehealth: Payer: Self-pay | Admitting: Internal Medicine

## 2021-05-11 NOTE — Telephone Encounter (Signed)
Spoke with patient to schedule AWV and follow up with PCP  Patient stated she has moved to [place] and is seeing dr Misty Stanley
